# Patient Record
Sex: Female | Born: 1955
Health system: Southern US, Community
[De-identification: ages and names within clinical notes are randomized; demographics above are authoritative.]

## PROBLEM LIST (undated history)

## (undated) DIAGNOSIS — E119 Type 2 diabetes mellitus without complications: Secondary | ICD-10-CM

## (undated) DIAGNOSIS — I10 Essential (primary) hypertension: Secondary | ICD-10-CM

## (undated) DIAGNOSIS — M1990 Unspecified osteoarthritis, unspecified site: Secondary | ICD-10-CM

## (undated) DIAGNOSIS — S82899A Other fracture of unspecified lower leg, initial encounter for closed fracture: Secondary | ICD-10-CM

## (undated) HISTORY — PX: TONSILLECTOMY: SUR1361

## (undated) HISTORY — PX: ANKLE FRACTURE SURGERY: SHX122

---

## 1999-03-16 ENCOUNTER — Other Ambulatory Visit: Admission: RE | Admit: 1999-03-16 | Discharge: 1999-03-16 | Payer: Self-pay | Admitting: Obstetrics and Gynecology

## 1999-08-03 ENCOUNTER — Encounter: Payer: Self-pay | Admitting: Obstetrics and Gynecology

## 1999-08-03 ENCOUNTER — Encounter: Admission: RE | Admit: 1999-08-03 | Discharge: 1999-08-03 | Payer: Self-pay | Admitting: Obstetrics and Gynecology

## 1999-08-18 ENCOUNTER — Encounter: Payer: Self-pay | Admitting: Obstetrics and Gynecology

## 1999-08-18 ENCOUNTER — Encounter: Admission: RE | Admit: 1999-08-18 | Discharge: 1999-08-18 | Payer: Self-pay | Admitting: Obstetrics and Gynecology

## 1999-09-06 ENCOUNTER — Ambulatory Visit (HOSPITAL_COMMUNITY): Admission: RE | Admit: 1999-09-06 | Discharge: 1999-09-06 | Payer: Self-pay | Admitting: *Deleted

## 1999-09-06 ENCOUNTER — Encounter (INDEPENDENT_AMBULATORY_CARE_PROVIDER_SITE_OTHER): Payer: Self-pay

## 2000-09-06 ENCOUNTER — Other Ambulatory Visit: Admission: RE | Admit: 2000-09-06 | Discharge: 2000-09-06 | Payer: Self-pay | Admitting: Obstetrics and Gynecology

## 2000-09-07 ENCOUNTER — Encounter: Admission: RE | Admit: 2000-09-07 | Discharge: 2000-09-07 | Payer: Self-pay | Admitting: Obstetrics and Gynecology

## 2000-09-07 ENCOUNTER — Encounter: Payer: Self-pay | Admitting: Obstetrics and Gynecology

## 2002-06-05 ENCOUNTER — Encounter: Payer: Self-pay | Admitting: Obstetrics and Gynecology

## 2002-06-05 ENCOUNTER — Encounter: Admission: RE | Admit: 2002-06-05 | Discharge: 2002-06-05 | Payer: Self-pay | Admitting: Obstetrics and Gynecology

## 2002-06-11 ENCOUNTER — Other Ambulatory Visit: Admission: RE | Admit: 2002-06-11 | Discharge: 2002-06-11 | Payer: Self-pay | Admitting: Obstetrics and Gynecology

## 2005-06-29 ENCOUNTER — Other Ambulatory Visit: Admission: RE | Admit: 2005-06-29 | Discharge: 2005-06-29 | Payer: Self-pay | Admitting: Obstetrics and Gynecology

## 2007-07-26 ENCOUNTER — Encounter: Admission: RE | Admit: 2007-07-26 | Discharge: 2007-07-26 | Payer: Self-pay | Admitting: Obstetrics and Gynecology

## 2009-05-28 ENCOUNTER — Encounter: Admission: RE | Admit: 2009-05-28 | Discharge: 2009-05-28 | Payer: Self-pay | Admitting: Otolaryngology

## 2009-06-03 ENCOUNTER — Encounter: Admission: RE | Admit: 2009-06-03 | Discharge: 2009-06-03 | Payer: Self-pay | Admitting: Otolaryngology

## 2010-02-03 ENCOUNTER — Inpatient Hospital Stay (HOSPITAL_COMMUNITY): Admission: EM | Admit: 2010-02-03 | Discharge: 2010-02-08 | Payer: Self-pay | Admitting: Emergency Medicine

## 2010-02-07 ENCOUNTER — Ambulatory Visit: Payer: Self-pay | Admitting: Physical Medicine & Rehabilitation

## 2010-07-04 ENCOUNTER — Encounter: Admission: RE | Admit: 2010-07-04 | Discharge: 2010-07-04 | Payer: Self-pay | Admitting: Orthopedic Surgery

## 2010-07-27 ENCOUNTER — Other Ambulatory Visit
Admission: RE | Admit: 2010-07-27 | Discharge: 2010-07-27 | Payer: Self-pay | Source: Home / Self Care | Admitting: Family Medicine

## 2010-08-03 ENCOUNTER — Encounter
Admission: RE | Admit: 2010-08-03 | Discharge: 2010-08-03 | Payer: Self-pay | Source: Home / Self Care | Attending: Family Medicine | Admitting: Family Medicine

## 2010-10-30 LAB — POCT I-STAT, CHEM 8
BUN: 17 mg/dL (ref 6–23)
Calcium, Ion: 1.2 mmol/L (ref 1.12–1.32)
Glucose, Bld: 127 mg/dL — ABNORMAL HIGH (ref 70–99)
HCT: 45 % (ref 36.0–46.0)
Hemoglobin: 15.3 g/dL — ABNORMAL HIGH (ref 12.0–15.0)
TCO2: 25 mmol/L (ref 0–100)

## 2010-10-30 LAB — CBC
HCT: 34.8 % — ABNORMAL LOW (ref 36.0–46.0)
Hemoglobin: 11.9 g/dL — ABNORMAL LOW (ref 12.0–15.0)
MCH: 29.9 pg (ref 26.0–34.0)
MCHC: 33.4 g/dL (ref 30.0–36.0)
MCHC: 33.5 g/dL (ref 30.0–36.0)
MCHC: 34.2 g/dL (ref 30.0–36.0)
MCV: 88.9 fL (ref 78.0–100.0)
MCV: 89.3 fL (ref 78.0–100.0)
Platelets: 272 10*3/uL (ref 150–400)
RBC: 3.92 MIL/uL (ref 3.87–5.11)
RBC: 4.82 MIL/uL (ref 3.87–5.11)
RDW: 14.6 % (ref 11.5–15.5)
WBC: 12.3 10*3/uL — ABNORMAL HIGH (ref 4.0–10.5)
WBC: 8.3 10*3/uL (ref 4.0–10.5)
WBC: 8.4 10*3/uL (ref 4.0–10.5)

## 2010-10-30 LAB — GLUCOSE, CAPILLARY
Glucose-Capillary: 106 mg/dL — ABNORMAL HIGH (ref 70–99)
Glucose-Capillary: 117 mg/dL — ABNORMAL HIGH (ref 70–99)
Glucose-Capillary: 121 mg/dL — ABNORMAL HIGH (ref 70–99)
Glucose-Capillary: 124 mg/dL — ABNORMAL HIGH (ref 70–99)
Glucose-Capillary: 134 mg/dL — ABNORMAL HIGH (ref 70–99)
Glucose-Capillary: 154 mg/dL — ABNORMAL HIGH (ref 70–99)
Glucose-Capillary: 170 mg/dL — ABNORMAL HIGH (ref 70–99)
Glucose-Capillary: 175 mg/dL — ABNORMAL HIGH (ref 70–99)

## 2010-10-30 LAB — COMPREHENSIVE METABOLIC PANEL
Albumin: 3.9 g/dL (ref 3.5–5.2)
Alkaline Phosphatase: 72 U/L (ref 39–117)
BUN: 15 mg/dL (ref 6–23)
CO2: 25 mEq/L (ref 19–32)
Calcium: 10.1 mg/dL (ref 8.4–10.5)
Creatinine, Ser: 0.94 mg/dL (ref 0.4–1.2)
GFR calc Af Amer: >60 mL/min (ref >60)
GFR calc non Af Amer: >60 mL/min (ref >60)
Glucose, Bld: 124 mg/dL — ABNORMAL HIGH (ref 70–99)

## 2010-10-30 LAB — BASIC METABOLIC PANEL
BUN: 14 mg/dL (ref 6–23)
CO2: 30 mEq/L (ref 19–32)
Calcium: 9.4 mg/dL (ref 8.4–10.5)
Chloride: 99 mEq/L (ref 96–112)
Creatinine, Ser: 1.05 mg/dL (ref 0.4–1.2)
GFR calc Af Amer: >60 mL/min (ref >60)
GFR calc non Af Amer: 47 mL/min — ABNORMAL LOW (ref >60)
GFR calc non Af Amer: 54 mL/min — ABNORMAL LOW (ref >60)
Glucose, Bld: 156 mg/dL — ABNORMAL HIGH (ref 70–99)
Potassium: 3.6 mEq/L (ref 3.5–5.1)
Potassium: 4.5 mEq/L (ref 3.5–5.1)

## 2010-10-30 LAB — DIFFERENTIAL
Basophils Absolute: 0.1 10*3/uL (ref 0.0–0.1)
Basophils Relative: 1 % (ref 0–1)
Eosinophils Relative: 2 % (ref 0–5)
Lymphocytes Relative: 12 % (ref 12–46)
Lymphs Abs: 1.4 10*3/uL (ref 0.7–4.0)
Neutrophils Relative %: 80 % — ABNORMAL HIGH (ref 43–77)

## 2010-10-30 LAB — PROTIME-INR: Prothrombin Time: 18.8 seconds — ABNORMAL HIGH (ref 11.6–15.2)

## 2013-12-10 ENCOUNTER — Other Ambulatory Visit: Payer: Self-pay | Admitting: Family Medicine

## 2013-12-10 DIAGNOSIS — Z1231 Encounter for screening mammogram for malignant neoplasm of breast: Secondary | ICD-10-CM

## 2013-12-10 DIAGNOSIS — M8580 Other specified disorders of bone density and structure, unspecified site: Secondary | ICD-10-CM

## 2013-12-31 ENCOUNTER — Ambulatory Visit
Admission: RE | Admit: 2013-12-31 | Discharge: 2013-12-31 | Disposition: A | Discharge status: Home / Self Care | Payer: 59 | Source: Ambulatory Visit | Attending: Family Medicine | Admitting: Family Medicine

## 2013-12-31 DIAGNOSIS — M8580 Other specified disorders of bone density and structure, unspecified site: Secondary | ICD-10-CM

## 2013-12-31 DIAGNOSIS — Z1231 Encounter for screening mammogram for malignant neoplasm of breast: Secondary | ICD-10-CM

## 2017-12-17 DIAGNOSIS — E119 Type 2 diabetes mellitus without complications: Secondary | ICD-10-CM | POA: Diagnosis not present

## 2017-12-17 DIAGNOSIS — E7800 Pure hypercholesterolemia, unspecified: Secondary | ICD-10-CM | POA: Diagnosis not present

## 2017-12-18 DIAGNOSIS — E1122 Type 2 diabetes mellitus with diabetic chronic kidney disease: Secondary | ICD-10-CM | POA: Diagnosis not present

## 2017-12-18 DIAGNOSIS — R05 Cough: Secondary | ICD-10-CM | POA: Diagnosis not present

## 2017-12-18 DIAGNOSIS — N183 Chronic kidney disease, stage 3 (moderate): Secondary | ICD-10-CM | POA: Diagnosis not present

## 2017-12-18 DIAGNOSIS — E7800 Pure hypercholesterolemia, unspecified: Secondary | ICD-10-CM | POA: Diagnosis not present

## 2018-02-18 DIAGNOSIS — E1122 Type 2 diabetes mellitus with diabetic chronic kidney disease: Secondary | ICD-10-CM | POA: Diagnosis not present

## 2018-02-18 DIAGNOSIS — N183 Chronic kidney disease, stage 3 (moderate): Secondary | ICD-10-CM | POA: Diagnosis not present

## 2018-05-10 DIAGNOSIS — E119 Type 2 diabetes mellitus without complications: Secondary | ICD-10-CM | POA: Diagnosis not present

## 2018-05-10 DIAGNOSIS — H5213 Myopia, bilateral: Secondary | ICD-10-CM | POA: Diagnosis not present

## 2018-05-10 DIAGNOSIS — H524 Presbyopia: Secondary | ICD-10-CM | POA: Diagnosis not present

## 2018-08-21 DIAGNOSIS — E1122 Type 2 diabetes mellitus with diabetic chronic kidney disease: Secondary | ICD-10-CM | POA: Diagnosis not present

## 2018-08-21 DIAGNOSIS — Z6841 Body Mass Index (BMI) 40.0 and over, adult: Secondary | ICD-10-CM | POA: Diagnosis not present

## 2018-08-21 DIAGNOSIS — N183 Chronic kidney disease, stage 3 (moderate): Secondary | ICD-10-CM | POA: Diagnosis not present

## 2018-08-21 DIAGNOSIS — E7800 Pure hypercholesterolemia, unspecified: Secondary | ICD-10-CM | POA: Diagnosis not present

## 2018-08-27 DIAGNOSIS — R0781 Pleurodynia: Secondary | ICD-10-CM | POA: Diagnosis not present

## 2018-08-27 DIAGNOSIS — R0789 Other chest pain: Secondary | ICD-10-CM | POA: Diagnosis not present

## 2018-08-27 DIAGNOSIS — I10 Essential (primary) hypertension: Secondary | ICD-10-CM | POA: Diagnosis not present

## 2018-08-27 DIAGNOSIS — R05 Cough: Secondary | ICD-10-CM | POA: Diagnosis not present

## 2018-09-23 DIAGNOSIS — I129 Hypertensive chronic kidney disease with stage 1 through stage 4 chronic kidney disease, or unspecified chronic kidney disease: Secondary | ICD-10-CM | POA: Diagnosis not present

## 2019-03-06 ENCOUNTER — Other Ambulatory Visit: Payer: Self-pay | Admitting: Family Medicine

## 2019-03-06 DIAGNOSIS — Z1231 Encounter for screening mammogram for malignant neoplasm of breast: Secondary | ICD-10-CM

## 2019-04-23 ENCOUNTER — Ambulatory Visit
Admission: RE | Admit: 2019-04-23 | Discharge: 2019-04-23 | Disposition: A | Discharge status: Home / Self Care | Payer: Commercial Managed Care - PPO | Source: Ambulatory Visit | Attending: Family Medicine | Admitting: Family Medicine

## 2019-04-23 ENCOUNTER — Other Ambulatory Visit: Payer: Self-pay

## 2019-04-23 DIAGNOSIS — Z1231 Encounter for screening mammogram for malignant neoplasm of breast: Secondary | ICD-10-CM

## 2019-04-24 ENCOUNTER — Other Ambulatory Visit: Payer: Self-pay | Admitting: Family Medicine

## 2019-04-24 DIAGNOSIS — R928 Other abnormal and inconclusive findings on diagnostic imaging of breast: Secondary | ICD-10-CM

## 2019-04-25 ENCOUNTER — Ambulatory Visit
Admission: RE | Admit: 2019-04-25 | Discharge: 2019-04-25 | Disposition: A | Discharge status: Home / Self Care | Payer: Commercial Managed Care - PPO | Source: Ambulatory Visit | Attending: Family Medicine | Admitting: Family Medicine

## 2019-04-25 ENCOUNTER — Other Ambulatory Visit: Payer: Self-pay

## 2019-04-25 DIAGNOSIS — R928 Other abnormal and inconclusive findings on diagnostic imaging of breast: Secondary | ICD-10-CM

## 2021-01-03 ENCOUNTER — Ambulatory Visit: Payer: Commercial Managed Care - PPO

## 2021-01-03 NOTE — Progress Notes (Signed)
   Covid-19 Vaccination Clinic  Name:  Patricia George    MRN: [id number] DOB: [birth year]  01/03/2021  Patricia George was observed post Covid-19 immunization for 15 minutes without incident. She was provided with Vaccine Information Sheet and instruction to access the V-Safe system.   Patricia George was instructed to call 911 with any severe reactions post vaccine: Marland Kitchen Difficulty breathing  . Swelling of face and throat  . A fast heartbeat  . A bad rash all over body  . Dizziness and weakness

## 2021-01-07 ENCOUNTER — Other Ambulatory Visit (HOSPITAL_BASED_OUTPATIENT_CLINIC_OR_DEPARTMENT_OTHER): Payer: Self-pay

## 2021-01-07 MED ORDER — PFIZER-BIONT COVID-19 VAC-TRIS 30 MCG/0.3ML IM SUSP
INTRAMUSCULAR | 0 refills | Status: AC
  Filled 2021-01-07: qty 0.3, 1d supply, fill #0

## 2021-01-11 ENCOUNTER — Other Ambulatory Visit (HOSPITAL_BASED_OUTPATIENT_CLINIC_OR_DEPARTMENT_OTHER): Payer: Self-pay

## 2021-02-21 DIAGNOSIS — N183 Chronic kidney disease, stage 3 unspecified: Secondary | ICD-10-CM | POA: Diagnosis not present

## 2021-02-21 DIAGNOSIS — E7800 Pure hypercholesterolemia, unspecified: Secondary | ICD-10-CM | POA: Diagnosis not present

## 2021-02-21 DIAGNOSIS — I129 Hypertensive chronic kidney disease with stage 1 through stage 4 chronic kidney disease, or unspecified chronic kidney disease: Secondary | ICD-10-CM | POA: Diagnosis not present

## 2021-02-21 DIAGNOSIS — E1122 Type 2 diabetes mellitus with diabetic chronic kidney disease: Secondary | ICD-10-CM | POA: Diagnosis not present

## 2021-02-21 DIAGNOSIS — Z7984 Long term (current) use of oral hypoglycemic drugs: Secondary | ICD-10-CM | POA: Diagnosis not present

## 2021-07-01 DIAGNOSIS — E7800 Pure hypercholesterolemia, unspecified: Secondary | ICD-10-CM | POA: Diagnosis not present

## 2021-07-01 DIAGNOSIS — E1122 Type 2 diabetes mellitus with diabetic chronic kidney disease: Secondary | ICD-10-CM | POA: Diagnosis not present

## 2021-07-01 DIAGNOSIS — Z0000 Encounter for general adult medical examination without abnormal findings: Secondary | ICD-10-CM | POA: Diagnosis not present

## 2021-07-01 DIAGNOSIS — Z23 Encounter for immunization: Secondary | ICD-10-CM | POA: Diagnosis not present

## 2021-07-01 DIAGNOSIS — I129 Hypertensive chronic kidney disease with stage 1 through stage 4 chronic kidney disease, or unspecified chronic kidney disease: Secondary | ICD-10-CM | POA: Diagnosis not present

## 2021-08-10 DIAGNOSIS — H524 Presbyopia: Secondary | ICD-10-CM | POA: Diagnosis not present

## 2021-08-10 DIAGNOSIS — E119 Type 2 diabetes mellitus without complications: Secondary | ICD-10-CM | POA: Diagnosis not present

## 2021-08-10 DIAGNOSIS — H5213 Myopia, bilateral: Secondary | ICD-10-CM | POA: Diagnosis not present

## 2022-01-11 ENCOUNTER — Other Ambulatory Visit: Payer: Self-pay | Admitting: Family Medicine

## 2022-01-11 DIAGNOSIS — E7800 Pure hypercholesterolemia, unspecified: Secondary | ICD-10-CM | POA: Diagnosis not present

## 2022-01-11 DIAGNOSIS — E1122 Type 2 diabetes mellitus with diabetic chronic kidney disease: Secondary | ICD-10-CM | POA: Diagnosis not present

## 2022-01-11 DIAGNOSIS — Z23 Encounter for immunization: Secondary | ICD-10-CM | POA: Diagnosis not present

## 2022-01-11 DIAGNOSIS — Z1231 Encounter for screening mammogram for malignant neoplasm of breast: Secondary | ICD-10-CM | POA: Diagnosis not present

## 2022-01-11 DIAGNOSIS — I129 Hypertensive chronic kidney disease with stage 1 through stage 4 chronic kidney disease, or unspecified chronic kidney disease: Secondary | ICD-10-CM | POA: Diagnosis not present

## 2022-01-11 DIAGNOSIS — I872 Venous insufficiency (chronic) (peripheral): Secondary | ICD-10-CM | POA: Diagnosis not present

## 2022-01-11 DIAGNOSIS — Z79899 Other long term (current) drug therapy: Secondary | ICD-10-CM | POA: Diagnosis not present

## 2022-01-11 DIAGNOSIS — I700 Atherosclerosis of aorta: Secondary | ICD-10-CM | POA: Diagnosis not present

## 2022-01-11 DIAGNOSIS — N183 Chronic kidney disease, stage 3 unspecified: Secondary | ICD-10-CM | POA: Diagnosis not present

## 2022-01-21 ENCOUNTER — Emergency Department (HOSPITAL_BASED_OUTPATIENT_CLINIC_OR_DEPARTMENT_OTHER): Payer: Medicare Other

## 2022-01-21 ENCOUNTER — Encounter (HOSPITAL_BASED_OUTPATIENT_CLINIC_OR_DEPARTMENT_OTHER): Payer: Self-pay | Admitting: Emergency Medicine

## 2022-01-21 ENCOUNTER — Emergency Department (HOSPITAL_BASED_OUTPATIENT_CLINIC_OR_DEPARTMENT_OTHER)
Admission: EM | Admit: 2022-01-21 | Discharge: 2022-01-21 | Disposition: A | Discharge status: Home / Self Care | Payer: Medicare Other | Attending: Emergency Medicine | Admitting: Emergency Medicine

## 2022-01-21 DIAGNOSIS — S199XXA Unspecified injury of neck, initial encounter: Secondary | ICD-10-CM | POA: Diagnosis not present

## 2022-01-21 DIAGNOSIS — M25522 Pain in left elbow: Secondary | ICD-10-CM | POA: Diagnosis not present

## 2022-01-21 DIAGNOSIS — W19XXXA Unspecified fall, initial encounter: Secondary | ICD-10-CM | POA: Diagnosis not present

## 2022-01-21 DIAGNOSIS — S42202A Unspecified fracture of upper end of left humerus, initial encounter for closed fracture: Secondary | ICD-10-CM | POA: Diagnosis not present

## 2022-01-21 DIAGNOSIS — S42352A Displaced comminuted fracture of shaft of humerus, left arm, initial encounter for closed fracture: Secondary | ICD-10-CM | POA: Diagnosis not present

## 2022-01-21 DIAGNOSIS — S42292A Other displaced fracture of upper end of left humerus, initial encounter for closed fracture: Secondary | ICD-10-CM | POA: Diagnosis not present

## 2022-01-21 DIAGNOSIS — M47812 Spondylosis without myelopathy or radiculopathy, cervical region: Secondary | ICD-10-CM | POA: Diagnosis not present

## 2022-01-21 DIAGNOSIS — S4992XA Unspecified injury of left shoulder and upper arm, initial encounter: Secondary | ICD-10-CM | POA: Diagnosis present

## 2022-01-21 DIAGNOSIS — S0990XA Unspecified injury of head, initial encounter: Secondary | ICD-10-CM | POA: Diagnosis not present

## 2022-01-21 DIAGNOSIS — S42295A Other nondisplaced fracture of upper end of left humerus, initial encounter for closed fracture: Secondary | ICD-10-CM | POA: Diagnosis not present

## 2022-01-21 HISTORY — DX: Type 2 diabetes mellitus without complications: E11.9

## 2022-01-21 HISTORY — DX: Other fracture of unspecified lower leg, initial encounter for closed fracture: S82.899A

## 2022-01-21 MED ORDER — TRAMADOL HCL 50 MG PO TABS
50.0000 mg | ORAL_TABLET | Freq: Once | ORAL | Status: AC
  Administered 2022-01-21: 50 mg via ORAL
  Filled 2022-01-21: qty 1

## 2022-01-21 MED ORDER — TRAMADOL HCL 50 MG PO TABS: 50.0000 mg | ORAL_TABLET | Freq: Three times a day (TID) | ORAL | 0 refills | Status: AC | PRN

## 2022-01-21 MED ORDER — ACETAMINOPHEN 500 MG PO TABS
1000.0000 mg | ORAL_TABLET | Freq: Once | ORAL | Status: AC
  Administered 2022-01-21: 1000 mg via ORAL
  Filled 2022-01-21: qty 2

## 2022-01-21 MED ORDER — KETOROLAC TROMETHAMINE 60 MG/2ML IM SOLN
30.0000 mg | Freq: Once | INTRAMUSCULAR | Status: AC
  Administered 2022-01-21: 30 mg via INTRAMUSCULAR
  Filled 2022-01-21: qty 2

## 2022-01-21 NOTE — ED Provider Notes (Addendum)
[facility] Provider Note   CSN: [id number] Arrival date & time: 01/21/22  [id number]     History  Chief Complaint  Patient presents with   Fall   Arm Injury    Patricia George is a 66 y.o. female.  The history is provided by the patient.  Fall This is a new problem. The current episode started less than 1 hour ago. The problem occurs constantly. The problem has not changed since onset.Pertinent negatives include no chest pain, no abdominal pain, no headaches and no shortness of breath. Nothing aggravates the symptoms. Nothing relieves the symptoms. She has tried nothing for the symptoms. The treatment provided no relief.  Arm Injury Location:  Arm Arm location:  L arm Injury: yes   Time since incident:  1 hour Mechanism of injury: fall   Fall:    Fall occurred:  Walking   Impact surface:  Hard floor   Point of impact:  Front   Entrapped after fall: no   Pain details:    Quality:  Aching   Radiates to:  Does not radiate   Severity:  Severe   Onset quality:  Sudden   Duration:  1 hour   Timing:  Constant   Progression:  Unchanged Dislocation: no   Foreign body present:  No foreign bodies Prior injury to area:  No Relieved by:  Nothing Worsened by:  Nothing Ineffective treatments:  None tried Associated symptoms: no back pain, no fever and no swelling   Risk factors: no concern for non-accidental trauma        Home Medications Prior to Admission medications   Medication Sig Start Date End Date Taking? Authorizing Provider  COVID-19 mRNA Vac-TriS, Pfizer, (PFIZER-BIONT COVID-19 VAC-TRIS) SUSP injection Inject into the muscle. 01/03/21   Carlyle Basques, MD      Allergies    Patient has no known allergies.    Review of Systems   Review of Systems  Constitutional:  Negative for fever.  Eyes:  Negative for redness.  Respiratory:  Negative for shortness of breath.   Cardiovascular:  Negative for chest pain.  Gastrointestinal:   Negative for abdominal pain.  Musculoskeletal:  Positive for arthralgias. Negative for back pain.  Neurological:  Negative for headaches.    Physical Exam Updated Vital Signs BP 131/78 (BP Location: Right Arm)   Pulse 78   Temp 98.2 F (36.8 C) (Oral)   Resp 18   Ht 5\' 7"  (1.702 m)   Wt 122.5 kg   SpO2 91%   BMI 42.29 kg/m  Physical Exam Vitals and nursing note reviewed.  Constitutional:      General: She is not in acute distress.    Appearance: Normal appearance.  HENT:     Head: Normocephalic and atraumatic.     Nose: Nose normal.  Eyes:     Conjunctiva/sclera: Conjunctivae normal.     Pupils: Pupils are equal, round, and reactive to light.  Cardiovascular:     Rate and Rhythm: Normal rate and regular rhythm.     Pulses: Normal pulses.     Heart sounds: Normal heart sounds.  Pulmonary:     Effort: Pulmonary effort is normal.     Breath sounds: Normal breath sounds.  Abdominal:     General: Abdomen is flat. Bowel sounds are normal.     Palpations: Abdomen is soft.     Tenderness: There is no abdominal tenderness. There is no guarding.  Musculoskeletal:     Left upper arm:  Tenderness and bony tenderness present. No swelling.     Left elbow: Normal.     Left forearm: Normal.     Left wrist: Normal. No snuff box tenderness.     Left hand: Normal.     Cervical back: Normal range of motion and neck supple.     Comments: Biceps and triceps tendons intact   Skin:    General: Skin is warm and dry.     Capillary Refill: Capillary refill takes less than 2 seconds.  Neurological:     General: No focal deficit present.     Mental Status: She is alert and oriented to person, place, and time.     Deep Tendon Reflexes: Reflexes normal.  Psychiatric:        Mood and Affect: Mood normal.        Behavior: Behavior normal.     ED Results / Procedures / Treatments   Labs (all labs ordered are listed, but only abnormal results are displayed) Labs Reviewed - No data to  display  EKG None  Radiology DG Humerus Left  Result Date: 01/21/2022 CLINICAL DATA:  Tripped over stool.  Fall. EXAM: LEFT HUMERUS - 2+ VIEW COMPARISON:  None Available. FINDINGS: There is an acute and comminuted fracture deformity involving the proximal left humerus. There is mild medial angulation of the distal fracture fragments. The left shoulder appears located. IMPRESSION: Acute and comminuted fracture deformity involves the proximal left humerus. Electronically Signed   By: Kerby Moors M.D.   On: 01/21/2022 05:54   CT Head Wo Contrast  Result Date: 01/21/2022 CLINICAL DATA:  Blunt poly trauma EXAM: CT HEAD WITHOUT CONTRAST CT CERVICAL SPINE WITHOUT CONTRAST TECHNIQUE: Multidetector CT imaging of the head and cervical spine was performed following the standard protocol without intravenous contrast. Multiplanar CT image reconstructions of the cervical spine were also generated. RADIATION DOSE REDUCTION: This exam was performed according to the departmental dose-optimization program which includes automated exposure control, adjustment of the mA and/or kV according to patient size and/or use of iterative reconstruction technique. COMPARISON:  None Available. FINDINGS: CT HEAD FINDINGS Brain: No evidence of swelling, infarction, hemorrhage, hydrocephalus, extra-axial collection or mass lesion/mass effect. Vascular: No hyperdense vessel or unexpected calcification. Skull: Normal. Negative for fracture or focal lesion. Sinuses/Orbits: No acute finding. CT CERVICAL SPINE FINDINGS Alignment: No traumatic malalignment Skull base and vertebrae: No acute fracture or incidental bone lesion Soft tissues and spinal canal: No prevertebral fluid or swelling. No visible canal hematoma. Disc levels: Ordinary lower cervical disc space narrowing and ridging. Upper cervical facet osteoarthritis. Upper chest: Negative IMPRESSION: No evidence of acute intracranial or cervical spine injury. Electronically Signed    By: Jorje Guild M.D.   On: 01/21/2022 05:39   CT Cervical Spine Wo Contrast  Result Date: 01/21/2022 CLINICAL DATA:  Blunt poly trauma EXAM: CT HEAD WITHOUT CONTRAST CT CERVICAL SPINE WITHOUT CONTRAST TECHNIQUE: Multidetector CT imaging of the head and cervical spine was performed following the standard protocol without intravenous contrast. Multiplanar CT image reconstructions of the cervical spine were also generated. RADIATION DOSE REDUCTION: This exam was performed according to the departmental dose-optimization program which includes automated exposure control, adjustment of the mA and/or kV according to patient size and/or use of iterative reconstruction technique. COMPARISON:  None Available. FINDINGS: CT HEAD FINDINGS Brain: No evidence of swelling, infarction, hemorrhage, hydrocephalus, extra-axial collection or mass lesion/mass effect. Vascular: No hyperdense vessel or unexpected calcification. Skull: Normal. Negative for fracture or focal lesion. Sinuses/Orbits: No acute  finding. CT CERVICAL SPINE FINDINGS Alignment: No traumatic malalignment Skull base and vertebrae: No acute fracture or incidental bone lesion Soft tissues and spinal canal: No prevertebral fluid or swelling. No visible canal hematoma. Disc levels: Ordinary lower cervical disc space narrowing and ridging. Upper cervical facet osteoarthritis. Upper chest: Negative IMPRESSION: No evidence of acute intracranial or cervical spine injury. Electronically Signed   By: Jorje Guild M.D.   On: 01/21/2022 05:39    Procedures Procedures    Medications Ordered in ED Medications  traMADol (ULTRAM) tablet 50 mg (50 mg Oral Given 01/21/22 0417)  acetaminophen (TYLENOL) tablet 1,000 mg (1,000 mg Oral Given 01/21/22 0417)  ketorolac (TORADOL) injection 30 mg (30 mg Intramuscular Given 01/21/22 0419)    ED Course/ Medical Decision Making/ A&P                           Medical Decision Making Patient fell and injured LUE. On  aspirin  Amount and/or Complexity of Data Reviewed Independent Historian: EMS    Details: see above Radiology: ordered and independent interpretation performed.    Details: humerus fracture comminuted, negative head and c spine CT Discussion of management or test interpretation with external provider(s): Case d/w Dr. Sammuel Hines, please CT LUE and place in sling and follow up in the office   Risk OTC drugs. Prescription drug management. Risk Details: Ice and maintain sling at all times.  Take all pain medication as directed.  Do not drink alcohol, drive a car or operate machinery while taking narcotic pain medication.  Follow up with Dr. Sammuel Hines.      Final Clinical Impression(s) / ED Diagnoses Final diagnoses:  None  Return for intractable cough, coughing up blood, fevers > 100.4 unrelieved by medication, shortness of breath, intractable vomiting, chest pain, shortness of breath, weakness, numbness, changes in speech, facial asymmetry, abdominal pain, passing out, Inability to tolerate liquids or food, cough, altered mental status or any concerns. No signs of systemic illness or infection. The patient is nontoxic-appearing on exam and vital signs are within normal limits.  I have reviewed the triage vital signs and the nursing notes. Pertinent labs & imaging results that were available during my care of the patient were reviewed by me and considered in my medical decision making (see chart for details). After history, exam, and medical workup I feel the patient has been appropriately medically screened and is safe for discharge home. Pertinent diagnoses were discussed with the patient. Patient was given return precautions.  Rx / DC Orders ED Discharge Orders     None           Bellatrix Devonshire, MD 01/21/22 NO:[id number]

## 2022-01-21 NOTE — Discharge Instructions (Signed)
You may take tylenol in addition to prescribed medication for pain.  You cannot drink alcohol, drive a car or operate machinery while taking pain medication.

## 2022-01-21 NOTE — ED Triage Notes (Signed)
Pt walking to bathroom about 2 hours ago. Tripped over a stool, fell. Pain to left outer upper arm.

## 2022-01-25 ENCOUNTER — Other Ambulatory Visit (HOSPITAL_BASED_OUTPATIENT_CLINIC_OR_DEPARTMENT_OTHER): Payer: Self-pay

## 2022-01-25 ENCOUNTER — Ambulatory Visit (INDEPENDENT_AMBULATORY_CARE_PROVIDER_SITE_OTHER): Payer: Medicare Other | Admitting: Orthopaedic Surgery

## 2022-01-25 DIAGNOSIS — S42332A Displaced oblique fracture of shaft of humerus, left arm, initial encounter for closed fracture: Secondary | ICD-10-CM

## 2022-01-25 MED ORDER — ACETAMINOPHEN 500 MG PO TABS
500.0000 mg | ORAL_TABLET | Freq: Three times a day (TID) | ORAL | 0 refills | Status: AC
  Filled 2022-01-25: qty 30, 10d supply, fill #0

## 2022-01-25 MED ORDER — IBUPROFEN 800 MG PO TABS
800.0000 mg | ORAL_TABLET | Freq: Three times a day (TID) | ORAL | 0 refills | Status: AC
  Filled 2022-01-25: qty 30, 10d supply, fill #0

## 2022-01-25 MED ORDER — OXYCODONE HCL 5 MG PO TABS
5.0000 mg | ORAL_TABLET | ORAL | 0 refills | Status: AC | PRN
  Filled 2022-01-25: qty 20, 4d supply, fill #0

## 2022-01-25 MED ORDER — ASPIRIN 325 MG PO TBEC
325.0000 mg | DELAYED_RELEASE_TABLET | Freq: Every day | ORAL | 0 refills | Status: AC
  Filled 2022-01-25: qty 30, 30d supply, fill #0

## 2022-01-25 NOTE — H&P (View-Only) (Signed)
Chief Complaint: Humerus fracture     History of Present Illness:    Patricia George is a 66 y.o. female right-hand-dominant female presents with a left proximal humeral shaft fracture she tripped over a stool at home.  She states that since that time she has had pain about the proximal aspect of the left shoulder.  She went to the emergency room and was placed in a sling and given follow-up here.  Since that time she has been back home.  She does live alone and is quite independent although she has been very limited by the left arm.  She is having an extremely difficult time doing even basic activities like feeding her cats.  She is taking Tylenol and tramadol for the pain at this time.  Denies any numbness in the left hand.  Overall her pain is mildly improving although she is quite concerned about her level of independence.  She enjoys knitting in her spare time.    Surgical History:   None  PMH/PSH/Family History/Social History/Meds/Allergies:    Past Medical History:  Diagnosis Date   Ankle fracture    Diabetes mellitus without complication (HCC)    Past Surgical History:  Procedure Laterality Date   ANKLE FRACTURE SURGERY Right    Social History   Socioeconomic History   Marital status: Single    Spouse name: Not on file   Number of children: Not on file   Years of education: Not on file   Highest education level: Not on file  Occupational History   Not on file  Tobacco Use   Smoking status: Never   Smokeless tobacco: Never  Substance and Sexual Activity   Alcohol use: Yes   Drug use: Never   Sexual activity: Not on file  Other Topics Concern   Not on file  Social History Narrative   Not on file   Social Determinants of Health   Financial Resource Strain: Not on file  Food Insecurity: Not on file  Transportation Needs: Not on file  Physical Activity: Not on file  Stress: Not on file  Social Connections: Not on file    No family history on file. No Known Allergies Current Outpatient Medications  Medication Sig Dispense Refill   acetaminophen (TYLENOL) 500 MG tablet Take 1 tablet (500 mg total) by mouth every 8 (eight) hours for 10 days. 30 tablet 0   aspirin EC 325 MG tablet Take 1 tablet (325 mg total) by mouth daily. 30 tablet 0   ibuprofen (ADVIL) 800 MG tablet Take 1 tablet (800 mg total) by mouth every 8 (eight) hours for 10 days. Please take with food, please alternate with acetaminophen 30 tablet 0   oxycodone (OXY-IR) 5 MG capsule Take 1 capsule (5 mg total) by mouth every 4 (four) hours as needed (severe pain). 20 capsule 0   COVID-19 mRNA Vac-TriS, Pfizer, (PFIZER-BIONT COVID-19 VAC-TRIS) SUSP injection Inject into the muscle. 0.3 mL 0   metFORMIN (GLUCOPHAGE-XR) 750 MG 24 hr tablet Take 1,500 mg by mouth daily.     metoprolol succinate (TOPROL-XL) 50 MG 24 hr tablet Take 50 mg by mouth daily.     pravastatin (PRAVACHOL) 40 MG tablet Take 40 mg by mouth daily.     sertraline (ZOLOFT) 50 MG tablet Take 50 mg by mouth daily.  traMADol (ULTRAM) 50 MG tablet Take 1 tablet (50 mg total) by mouth every 8 (eight) hours as needed for severe pain. 11 tablet 0   No current facility-administered medications for this visit.   No results found.  Review of Systems:   A ROS was performed including pertinent positives and negatives as documented in the HPI.  Physical Exam :   Constitutional: NAD and appears stated age Neurological: Alert and oriented Psych: Appropriate affect and cooperative There were no vitals taken for this visit.   Comprehensive Musculoskeletal Exam:    Left arm is in a sling.  Sensation is intact light touch in all distributions of the left arm including axillary musculocutaneous ulnar median and radial.  Able to fire EPL as well as wrist extensors of the left wrist.  She is able to make a solid composite fist.  2+ radial pulse  Imaging:   Xray (2 views left humerus): Left  proximal humeral shaft fracture in good alignment  CT scan left shoulder: There is a proximal metaphyseal fracture involving the proximal humerus without extension into the head  I personally reviewed and interpreted the radiographs.   Assessment:   66 y.o. female right-hand-dominant presents with a left proximal humeral shaft fracture after a trip over stool.  Overall today's visit we did have a long discussion regarding operative versus nonoperative management.  She is diabetic currently on metformin not taking insulin.  I did discuss that with nonoperative management there would be a period of bracing with a Sarmiento brace allowing the callus to form.  We discussed that this would limit her quite significantly while this is healing.  With regard to operative fixation I would recommend potentially a humeral nail in order to give her weightbearing construct.  We did discuss the specific risks associated with this including shoulder pain and rotator cuff pain as well as nerve injury that could result in wrist drop.  We discussed the risks and benefits of this.  We also discussed that diabetes cannot only impact wound healing surgically but also callus formation nonsurgically.  After long discussion of both options she is ultimately elected for a humeral nail as she currently lives alone and is quite concerned about her ability to do basic tasks like feeding her cats.  We will plan for left humeral nail as result.  Plan :    -Plan for left humeral intramedullary nail   After a lengthy discussion of treatment options, including risks, benefits, alternatives, complications of surgical and nonsurgical conservative options, the patient elected surgical repair.   The patient  is aware of the material risks  and complications including, but not limited to injury to adjacent structures, neurovascular injury, infection, numbness, bleeding, implant failure, thermal burns, stiffness, persistent pain, failure  to heal, disease transmission from allograft, need for further surgery, dislocation, anesthetic risks, blood clots, risks of death,and others. The probabilities of surgical success and failure discussed with patient given their particular co-morbidities.The time and nature of expected rehabilitation and recovery was discussed.The patient's questions were all answered preoperatively.  No barriers to understanding were noted. I explained the natural history of the disease process and Rx rationale.  I explained to the patient what I considered to be reasonable expectations given their personal situation.  The final treatment plan was arrived at through a shared patient decision making process model.      I personally saw and evaluated the patient, and participated in the management and treatment plan.  Huel Cote, MD Attending  Physician, Orthopedic Surgery  This document was dictated using Dragon voice recognition software. A reasonable attempt at proof reading has been made to minimize errors.

## 2022-01-25 NOTE — Progress Notes (Signed)
Chief Complaint: Humerus fracture     History of Present Illness:    Patricia George is a 66 y.o. female right-hand-dominant female presents with a left proximal humeral shaft fracture she tripped over a stool at home.  She states that since that time she has had pain about the proximal aspect of the left shoulder.  She went to the emergency room and was placed in a sling and given follow-up here.  Since that time she has been back home.  She does live alone and is quite independent although she has been very limited by the left arm.  She is having an extremely difficult time doing even basic activities like feeding her cats.  She is taking Tylenol and tramadol for the pain at this time.  Denies any numbness in the left hand.  Overall her pain is mildly improving although she is quite concerned about her level of independence.  She enjoys knitting in her spare time.    Surgical History:   None  PMH/PSH/Family History/Social History/Meds/Allergies:    Past Medical History:  Diagnosis Date   Ankle fracture    Diabetes mellitus without complication (HCC)    Past Surgical History:  Procedure Laterality Date   ANKLE FRACTURE SURGERY Right    Social History   Socioeconomic History   Marital status: Single    Spouse name: Not on file   Number of children: Not on file   Years of education: Not on file   Highest education level: Not on file  Occupational History   Not on file  Tobacco Use   Smoking status: Never   Smokeless tobacco: Never  Substance and Sexual Activity   Alcohol use: Yes   Drug use: Never   Sexual activity: Not on file  Other Topics Concern   Not on file  Social History Narrative   Not on file   Social Determinants of Health   Financial Resource Strain: Not on file  Food Insecurity: Not on file  Transportation Needs: Not on file  Physical Activity: Not on file  Stress: Not on file  Social Connections: Not on file    No family history on file. No Known Allergies Current Outpatient Medications  Medication Sig Dispense Refill   acetaminophen (TYLENOL) 500 MG tablet Take 1 tablet (500 mg total) by mouth every 8 (eight) hours for 10 days. 30 tablet 0   aspirin EC 325 MG tablet Take 1 tablet (325 mg total) by mouth daily. 30 tablet 0   ibuprofen (ADVIL) 800 MG tablet Take 1 tablet (800 mg total) by mouth every 8 (eight) hours for 10 days. Please take with food, please alternate with acetaminophen 30 tablet 0   oxycodone (OXY-IR) 5 MG capsule Take 1 capsule (5 mg total) by mouth every 4 (four) hours as needed (severe pain). 20 capsule 0   COVID-19 mRNA Vac-TriS, Pfizer, (PFIZER-BIONT COVID-19 VAC-TRIS) SUSP injection Inject into the muscle. 0.3 mL 0   metFORMIN (GLUCOPHAGE-XR) 750 MG 24 hr tablet Take 1,500 mg by mouth daily.     metoprolol succinate (TOPROL-XL) 50 MG 24 hr tablet Take 50 mg by mouth daily.     pravastatin (PRAVACHOL) 40 MG tablet Take 40 mg by mouth daily.     sertraline (ZOLOFT) 50 MG tablet Take 50 mg by mouth daily.  traMADol (ULTRAM) 50 MG tablet Take 1 tablet (50 mg total) by mouth every 8 (eight) hours as needed for severe pain. 11 tablet 0   No current facility-administered medications for this visit.   No results found.  Review of Systems:   A ROS was performed including pertinent positives and negatives as documented in the HPI.  Physical Exam :   Constitutional: NAD and appears stated age Neurological: Alert and oriented Psych: Appropriate affect and cooperative There were no vitals taken for this visit.   Comprehensive Musculoskeletal Exam:    Left arm is in a sling.  Sensation is intact light touch in all distributions of the left arm including axillary musculocutaneous ulnar median and radial.  Able to fire EPL as well as wrist extensors of the left wrist.  She is able to make a solid composite fist.  2+ radial pulse  Imaging:   Xray (2 views left humerus): Left  proximal humeral shaft fracture in good alignment  CT scan left shoulder: There is a proximal metaphyseal fracture involving the proximal humerus without extension into the head  I personally reviewed and interpreted the radiographs.   Assessment:   66 y.o. female right-hand-dominant presents with a left proximal humeral shaft fracture after a trip over stool.  Overall today's visit we did have a long discussion regarding operative versus nonoperative management.  She is diabetic currently on metformin not taking insulin.  I did discuss that with nonoperative management there would be a period of bracing with a Sarmiento brace allowing the callus to form.  We discussed that this would limit her quite significantly while this is healing.  With regard to operative fixation I would recommend potentially a humeral nail in order to give her weightbearing construct.  We did discuss the specific risks associated with this including shoulder pain and rotator cuff pain as well as nerve injury that could result in wrist drop.  We discussed the risks and benefits of this.  We also discussed that diabetes cannot only impact wound healing surgically but also callus formation nonsurgically.  After long discussion of both options she is ultimately elected for a humeral nail as she currently lives alone and is quite concerned about her ability to do basic tasks like feeding her cats.  We will plan for left humeral nail as result.  Plan :    -Plan for left humeral intramedullary nail   After a lengthy discussion of treatment options, including risks, benefits, alternatives, complications of surgical and nonsurgical conservative options, the patient elected surgical repair.   The patient  is aware of the material risks  and complications including, but not limited to injury to adjacent structures, neurovascular injury, infection, numbness, bleeding, implant failure, thermal burns, stiffness, persistent pain, failure  to heal, disease transmission from allograft, need for further surgery, dislocation, anesthetic risks, blood clots, risks of death,and others. The probabilities of surgical success and failure discussed with patient given their particular co-morbidities.The time and nature of expected rehabilitation and recovery was discussed.The patient's questions were all answered preoperatively.  No barriers to understanding were noted. I explained the natural history of the disease process and Rx rationale.  I explained to the patient what I considered to be reasonable expectations given their personal situation.  The final treatment plan was arrived at through a shared patient decision making process model.      I personally saw and evaluated the patient, and participated in the management and treatment plan.  Huel Cote, MD Attending  Physician, Orthopedic Surgery  This document was dictated using Dragon voice recognition software. A reasonable attempt at proof reading has been made to minimize errors.

## 2022-01-27 ENCOUNTER — Encounter (HOSPITAL_COMMUNITY): Payer: Self-pay | Admitting: Orthopaedic Surgery

## 2022-01-27 ENCOUNTER — Other Ambulatory Visit: Payer: Self-pay

## 2022-01-27 NOTE — Progress Notes (Signed)
PCP - Sigmund Hazel, MD Cardiologist - denies  PPM/ICD - denies  Chest x-ray - n/a EKG - day of surgery - 01/31/22 Stress Test - denies ECHO - denies Cardiac Cath - denies  CPAP - n/a  Fasting Blood Sugar - patient is not checking CBG at home  Blood Thinner Instructions: n/a Aspirin Instructions: - last dose per patient - 01/20/22  ERAS Protcol - yes, until 12:00  COVID TEST- n/a  Anesthesia review: no  Patient verbally denies any shortness of breath, fever, cough and chest pain during phone call   -------------  SDW INSTRUCTIONS given:  Your procedure is scheduled on Tuesday, June 20th, 2023.  Report to [facility] Main Entrance "A" at 12:30 A.M., and check in at the Admitting office.  Call this number if you have problems the morning of surgery:  [phone]   Remember:  Do not eat after midnight the night before your surgery  You may drink clear liquids until 12:00 the morning of your surgery.   Clear liquids allowed are: Water, Non-Citrus Juices (without pulp), Carbonated Beverages, Clear Tea, Black Coffee Only, and Gatorade    Take these medicines the morning of surgery with A SIP OF WATER Metoprolol, Pravastatin, Zoloft; PRN: Tylenol, Oxycodone, Tramadol.  Patient was instructed to hold Metformin the day of surgery.  As of today, STOP taking any Aspirin (unless otherwise instructed by your surgeon) Aleve, Naproxen, Ibuprofen, Motrin, Advil, Goody's, BC's, all herbal medications, fish oil, and all vitamins.   The day of surgery:                     Do not wear jewelry, make up, or nail polish            Do not wear lotions, powders, perfumes, or deodorant.            Do not shave 48 hours prior to surgery.              Do not bring valuables to the hospital.            [facility] is not responsible for any belongings or valuables.  Do NOT Smoke (Tobacco/Vaping) 24 hours prior to your procedure If you use a CPAP at night, you may bring all equipment for  your overnight stay.   Contacts, glasses, dentures or bridgework may not be worn into surgery.      For patients admitted to the hospital, discharge time will be determined by your treatment team.   Patients discharged the day of surgery will not be allowed to drive home, and someone needs to stay with them for 24 hours.  Special instructions:   [facility]- Preparing For Surgery  Before surgery, you can play an important role. Because skin is not sterile, your skin needs to be as free of germs as possible. You can reduce the number of germs on your skin by washing with CHG (chlorahexidine gluconate) Soap before surgery.  CHG is an antiseptic cleaner which kills germs and bonds with the skin to continue killing germs even after washing.    Oral Hygiene is also important to reduce your risk of infection.  Remember - BRUSH YOUR TEETH THE MORNING OF SURGERY WITH YOUR REGULAR TOOTHPASTE  Please do not use if you have an allergy to CHG or antibacterial soaps. If your skin becomes reddened/irritated stop using the CHG.  Do not shave (including legs and underarms) for at least 48 hours prior to first CHG shower. It  is OK to shave your face.  Please follow these instructions carefully.   Shower the NIGHT BEFORE SURGERY and the MORNING OF SURGERY with DIAL Soap.   Pat yourself dry with a CLEAN TOWEL.  Wear CLEAN PAJAMAS to bed the night before surgery  Place CLEAN SHEETS on your bed the night of your first shower and DO NOT SLEEP WITH PETS.   Day of Surgery: Please shower morning of surgery  Wear Clean/Comfortable clothing the morning of surgery Do not apply any deodorants/lotions.   Remember to brush your teeth WITH YOUR REGULAR TOOTHPASTE.   Questions were answered. Patient verbalized understanding of instructions.

## 2022-01-30 ENCOUNTER — Ambulatory Visit (HOSPITAL_BASED_OUTPATIENT_CLINIC_OR_DEPARTMENT_OTHER): Payer: Self-pay | Admitting: Orthopaedic Surgery

## 2022-01-30 DIAGNOSIS — S42332A Displaced oblique fracture of shaft of humerus, left arm, initial encounter for closed fracture: Secondary | ICD-10-CM

## 2022-01-31 ENCOUNTER — Other Ambulatory Visit: Payer: Self-pay

## 2022-01-31 ENCOUNTER — Encounter (HOSPITAL_COMMUNITY): Payer: Self-pay | Admitting: Orthopaedic Surgery

## 2022-01-31 ENCOUNTER — Ambulatory Visit (HOSPITAL_COMMUNITY)
Admission: RE | Admit: 2022-01-31 | Discharge: 2022-01-31 | Disposition: A | Discharge status: Home / Self Care | Payer: Medicare Other | Attending: Orthopaedic Surgery | Admitting: Orthopaedic Surgery

## 2022-01-31 ENCOUNTER — Ambulatory Visit (HOSPITAL_COMMUNITY): Payer: Medicare Other | Admitting: Anesthesiology

## 2022-01-31 ENCOUNTER — Ambulatory Visit (HOSPITAL_COMMUNITY): Payer: Medicare Other

## 2022-01-31 ENCOUNTER — Encounter (HOSPITAL_COMMUNITY)
Admission: RE | Disposition: A | Discharge status: Home / Self Care | Payer: Self-pay | Source: Home / Self Care | Attending: Orthopaedic Surgery

## 2022-01-31 ENCOUNTER — Ambulatory Visit (HOSPITAL_BASED_OUTPATIENT_CLINIC_OR_DEPARTMENT_OTHER): Payer: Medicare Other | Admitting: Anesthesiology

## 2022-01-31 DIAGNOSIS — S42202A Unspecified fracture of upper end of left humerus, initial encounter for closed fracture: Secondary | ICD-10-CM | POA: Diagnosis not present

## 2022-01-31 DIAGNOSIS — E119 Type 2 diabetes mellitus without complications: Secondary | ICD-10-CM | POA: Insufficient documentation

## 2022-01-31 DIAGNOSIS — S42302A Unspecified fracture of shaft of humerus, left arm, initial encounter for closed fracture: Secondary | ICD-10-CM | POA: Diagnosis not present

## 2022-01-31 DIAGNOSIS — Z7984 Long term (current) use of oral hypoglycemic drugs: Secondary | ICD-10-CM

## 2022-01-31 DIAGNOSIS — S42332A Displaced oblique fracture of shaft of humerus, left arm, initial encounter for closed fracture: Secondary | ICD-10-CM

## 2022-01-31 DIAGNOSIS — Z6841 Body Mass Index (BMI) 40.0 and over, adult: Secondary | ICD-10-CM | POA: Diagnosis not present

## 2022-01-31 DIAGNOSIS — I10 Essential (primary) hypertension: Secondary | ICD-10-CM | POA: Insufficient documentation

## 2022-01-31 DIAGNOSIS — W010XXA Fall on same level from slipping, tripping and stumbling without subsequent striking against object, initial encounter: Secondary | ICD-10-CM | POA: Diagnosis not present

## 2022-01-31 DIAGNOSIS — E785 Hyperlipidemia, unspecified: Secondary | ICD-10-CM | POA: Insufficient documentation

## 2022-01-31 DIAGNOSIS — S42352A Displaced comminuted fracture of shaft of humerus, left arm, initial encounter for closed fracture: Secondary | ICD-10-CM | POA: Insufficient documentation

## 2022-01-31 DIAGNOSIS — G8918 Other acute postprocedural pain: Secondary | ICD-10-CM | POA: Diagnosis not present

## 2022-01-31 DIAGNOSIS — Z79899 Other long term (current) drug therapy: Secondary | ICD-10-CM | POA: Insufficient documentation

## 2022-01-31 HISTORY — DX: Essential (primary) hypertension: I10

## 2022-01-31 HISTORY — DX: Unspecified osteoarthritis, unspecified site: M19.90

## 2022-01-31 HISTORY — PX: HUMERUS IM NAIL: SHX1769

## 2022-01-31 LAB — CBC
HCT: 38.5 % (ref 36.0–46.0)
Hemoglobin: 12.9 g/dL (ref 12.0–15.0)
MCH: 30.4 pg (ref 26.0–34.0)
MCHC: 33.5 g/dL (ref 30.0–36.0)
MCV: 90.6 fL (ref 80.0–100.0)
Platelets: 368 10*3/uL (ref 150–400)
RBC: 4.25 MIL/uL (ref 3.87–5.11)
RDW: 12.1 % (ref 11.5–15.5)
WBC: 7.6 10*3/uL (ref 4.0–10.5)
nRBC: 0 % (ref 0.0–0.2)

## 2022-01-31 LAB — BASIC METABOLIC PANEL
Anion gap: 12 (ref 5–15)
BUN: 13 mg/dL (ref 8–23)
CO2: 22 mmol/L (ref 22–32)
Calcium: 10.4 mg/dL — ABNORMAL HIGH (ref 8.9–10.3)
Chloride: 105 mmol/L (ref 98–111)
Creatinine, Ser: 0.88 mg/dL (ref 0.44–1.00)
GFR, Estimated: >60 mL/min (ref >60)
Glucose, Bld: 148 mg/dL — ABNORMAL HIGH (ref 70–99)
Potassium: 3.9 mmol/L (ref 3.5–5.1)
Sodium: 139 mmol/L (ref 135–145)

## 2022-01-31 LAB — GLUCOSE, CAPILLARY
Glucose-Capillary: 150 mg/dL — ABNORMAL HIGH (ref 70–99)
Glucose-Capillary: 76 mg/dL (ref 70–99)

## 2022-01-31 SURGERY — INSERTION, INTRAMEDULLARY ROD, HUMERUS
Anesthesia: General | Laterality: Left

## 2022-01-31 MED ORDER — ACETAMINOPHEN 500 MG PO TABS
1000.0000 mg | ORAL_TABLET | Freq: Once | ORAL | Status: AC
  Administered 2022-01-31: 1000 mg via ORAL
  Filled 2022-01-31: qty 2

## 2022-01-31 MED ORDER — ROCURONIUM BROMIDE 10 MG/ML (PF) SYRINGE
PREFILLED_SYRINGE | INTRAVENOUS | Status: AC
Start: 2022-01-31 — End: ?
  Filled 2022-01-31: qty 10

## 2022-01-31 MED ORDER — ONDANSETRON HCL 4 MG/2ML IJ SOLN
INTRAMUSCULAR | Status: DC | PRN
  Administered 2022-01-31: 4 mg via INTRAVENOUS

## 2022-01-31 MED ORDER — LACTATED RINGERS IV SOLN
INTRAVENOUS | Status: DC
Start: 2022-01-31 — End: 2022-01-31

## 2022-01-31 MED ORDER — PROPOFOL 10 MG/ML IV BOLUS
INTRAVENOUS | Status: DC | PRN
  Administered 2022-01-31: 16 mg via INTRAVENOUS

## 2022-01-31 MED ORDER — SUCCINYLCHOLINE CHLORIDE 200 MG/10ML IV SOSY
PREFILLED_SYRINGE | INTRAVENOUS | Status: AC
  Filled 2022-01-31: qty 10

## 2022-01-31 MED ORDER — MIDAZOLAM HCL 2 MG/2ML IJ SOLN
INTRAMUSCULAR | Status: AC
  Filled 2022-01-31: qty 2

## 2022-01-31 MED ORDER — SUGAMMADEX SODIUM 200 MG/2ML IV SOLN
INTRAVENOUS | Status: DC | PRN
  Administered 2022-01-31: 245 mg via INTRAVENOUS

## 2022-01-31 MED ORDER — LIDOCAINE 2% (20 MG/ML) 5 ML SYRINGE
INTRAMUSCULAR | Status: DC | PRN
  Administered 2022-01-31: 40 mg via INTRAVENOUS

## 2022-01-31 MED ORDER — FENTANYL CITRATE (PF) 250 MCG/5ML IJ SOLN
INTRAMUSCULAR | Status: AC
  Filled 2022-01-31: qty 5

## 2022-01-31 MED ORDER — GABAPENTIN 300 MG PO CAPS
300.0000 mg | ORAL_CAPSULE | Freq: Once | ORAL | Status: AC
  Administered 2022-01-31: 300 mg via ORAL
  Filled 2022-01-31: qty 1

## 2022-01-31 MED ORDER — INSULIN ASPART 100 UNIT/ML IJ SOLN
0.0000 [IU] | INTRAMUSCULAR | Status: DC | PRN
  Administered 2022-01-31: 2 [IU] via SUBCUTANEOUS
  Filled 2022-01-31: qty 1

## 2022-01-31 MED ORDER — TRANEXAMIC ACID-NACL 1000-0.7 MG/100ML-% IV SOLN: 1000.0000 mg | INTRAVENOUS | Status: DC

## 2022-01-31 MED ORDER — ONDANSETRON HCL 4 MG/2ML IJ SOLN
INTRAMUSCULAR | Status: AC
  Filled 2022-01-31: qty 8

## 2022-01-31 MED ORDER — ORAL CARE MOUTH RINSE: 15.0000 mL | Freq: Once | OROMUCOSAL | Status: AC

## 2022-01-31 MED ORDER — LIDOCAINE 2% (20 MG/ML) 5 ML SYRINGE
INTRAMUSCULAR | Status: AC
Start: 2022-01-31 — End: ?
  Filled 2022-01-31: qty 10

## 2022-01-31 MED ORDER — BUPIVACAINE LIPOSOME 1.3 % IJ SUSP
INTRAMUSCULAR | Status: DC | PRN
  Administered 2022-01-31: 10 mL via PERINEURAL

## 2022-01-31 MED ORDER — PHENYLEPHRINE HCL-NACL 20-0.9 MG/250ML-% IV SOLN
INTRAVENOUS | Status: DC | PRN
  Administered 2022-01-31: 25 ug/min via INTRAVENOUS

## 2022-01-31 MED ORDER — CEFAZOLIN IN SODIUM CHLORIDE 3-0.9 GM/100ML-% IV SOLN
3.0000 g | INTRAVENOUS | Status: AC
  Administered 2022-01-31: 3 g via INTRAVENOUS
  Filled 2022-01-31: qty 100

## 2022-01-31 MED ORDER — FENTANYL CITRATE (PF) 250 MCG/5ML IJ SOLN
INTRAMUSCULAR | Status: DC | PRN
  Administered 2022-01-31: 50 ug via INTRAVENOUS

## 2022-01-31 MED ORDER — PHENYLEPHRINE HCL (PRESSORS) 10 MG/ML IV SOLN
INTRAVENOUS | Status: DC | PRN
  Administered 2022-01-31: 80 ug via INTRAVENOUS

## 2022-01-31 MED ORDER — MIDAZOLAM HCL 2 MG/2ML IJ SOLN
INTRAMUSCULAR | Status: AC
  Administered 2022-01-31: 1 mg via INTRAVENOUS
  Filled 2022-01-31: qty 2

## 2022-01-31 MED ORDER — ROCURONIUM BROMIDE 10 MG/ML (PF) SYRINGE
PREFILLED_SYRINGE | INTRAVENOUS | Status: DC | PRN
  Administered 2022-01-31: 70 mg via INTRAVENOUS

## 2022-01-31 MED ORDER — FENTANYL CITRATE (PF) 100 MCG/2ML IJ SOLN
INTRAMUSCULAR | Status: AC
  Administered 2022-01-31: 50 ug via INTRAVENOUS
  Filled 2022-01-31: qty 2

## 2022-01-31 MED ORDER — BUPIVACAINE HCL (PF) 0.5 % IJ SOLN
INTRAMUSCULAR | Status: DC | PRN
  Administered 2022-01-31: 15 mL via PERINEURAL

## 2022-01-31 MED ORDER — FENTANYL CITRATE (PF) 100 MCG/2ML IJ SOLN: 50.0000 ug | Freq: Once | INTRAMUSCULAR | Status: AC

## 2022-01-31 MED ORDER — MIDAZOLAM HCL 2 MG/2ML IJ SOLN: 1.0000 mg | Freq: Once | INTRAMUSCULAR | Status: AC

## 2022-01-31 MED ORDER — 0.9 % SODIUM CHLORIDE (POUR BTL) OPTIME
TOPICAL | Status: DC | PRN
  Administered 2022-01-31: 1000 mL

## 2022-01-31 MED ORDER — PROPOFOL 10 MG/ML IV BOLUS
INTRAVENOUS | Status: AC
  Filled 2022-01-31: qty 20

## 2022-01-31 MED ORDER — DEXAMETHASONE SODIUM PHOSPHATE 10 MG/ML IJ SOLN
INTRAMUSCULAR | Status: AC
  Filled 2022-01-31: qty 2

## 2022-01-31 MED ORDER — CHLORHEXIDINE GLUCONATE 0.12 % MT SOLN
15.0000 mL | Freq: Once | OROMUCOSAL | Status: AC
  Administered 2022-01-31: 15 mL via OROMUCOSAL
  Filled 2022-01-31: qty 15

## 2022-01-31 SURGICAL SUPPLY — 66 items
BAG COUNTER SPONGE SURGICOUNT (BAG) ×2 IMPLANT
BIT DRILL LNG GRA BRD PT 3.2MM (BIT) IMPLANT
BRUSH SCRUB EZ PLAIN DRY (MISCELLANEOUS) ×4 IMPLANT
COOLER ICEMAN CLASSIC (MISCELLANEOUS) ×1 IMPLANT
COVER SURGICAL LIGHT HANDLE (MISCELLANEOUS) ×4 IMPLANT
DRAPE C-ARM 42X72 X-RAY (DRAPES) ×2 IMPLANT
DRAPE C-ARMOR (DRAPES) ×1 IMPLANT
DRAPE IMP U-DRAPE 54X76 (DRAPES) ×2 IMPLANT
DRAPE INCISE IOBAN 66X45 STRL (DRAPES) ×2 IMPLANT
DRAPE ORTHO SPLIT 77X108 STRL (DRAPES) ×4
DRAPE SURG ORHT 6 SPLT 77X108 (DRAPES) IMPLANT
DRAPE U-SHAPE 47X51 STRL (DRAPES) ×2 IMPLANT
DRILL LONG GRAD BRAD PT 3.2MM (BIT) ×2
DRSG ADAPTIC 3X8 NADH LF (GAUZE/BANDAGES/DRESSINGS) ×2 IMPLANT
DRSG AQUACEL AG ADV 3.5X 6 (GAUZE/BANDAGES/DRESSINGS) ×2 IMPLANT
DRSG PAD ABDOMINAL 8X10 ST (GAUZE/BANDAGES/DRESSINGS) ×6 IMPLANT
ELECT REM PT RETURN 9FT ADLT (ELECTROSURGICAL)
ELECTRODE REM PT RTRN 9FT ADLT (ELECTROSURGICAL) IMPLANT
GAUZE SPONGE 4X4 12PLY STRL (GAUZE/BANDAGES/DRESSINGS) ×2 IMPLANT
GAUZE XEROFORM 1X8 LF (GAUZE/BANDAGES/DRESSINGS) ×1 IMPLANT
GLOVE BIO SURGEON STRL SZ7.5 (GLOVE) ×2 IMPLANT
GLOVE BIO SURGEON STRL SZ8 (GLOVE) ×2 IMPLANT
GLOVE BIOGEL PI IND STRL 7.5 (GLOVE) ×1 IMPLANT
GLOVE BIOGEL PI IND STRL 8 (GLOVE) ×1 IMPLANT
GLOVE BIOGEL PI INDICATOR 7.5 (GLOVE) ×1
GLOVE BIOGEL PI INDICATOR 8 (GLOVE) ×1
GLOVE SURG ORTHO LTX SZ7.5 (GLOVE) ×4 IMPLANT
GOWN STRL REUS W/ TWL LRG LVL3 (GOWN DISPOSABLE) ×2 IMPLANT
GOWN STRL REUS W/ TWL XL LVL3 (GOWN DISPOSABLE) ×1 IMPLANT
GOWN STRL REUS W/TWL LRG LVL3 (GOWN DISPOSABLE) ×4
GOWN STRL REUS W/TWL XL LVL3 (GOWN DISPOSABLE) ×2
GUIDE ROD BALL TIP 2.0 DISP (Rod) ×4 IMPLANT
GUIDEWIRE TROCAR TRIG 3.2 DISP (Pin) ×1 IMPLANT
KIT BASIN OR (CUSTOM PROCEDURE TRAY) ×2 IMPLANT
KIT TURNOVER KIT B (KITS) ×2 IMPLANT
MANIFOLD NEPTUNE II (INSTRUMENTS) ×2 IMPLANT
NAIL CANN IM AR TRIG 8/7X260 (Nail) ×1 IMPLANT
NS IRRIG 1000ML POUR BTL (IV SOLUTION) ×2 IMPLANT
PACK TOTAL JOINT (CUSTOM PROCEDURE TRAY) ×2 IMPLANT
PACK UNIVERSAL I (CUSTOM PROCEDURE TRAY) ×2 IMPLANT
PAD ARMBOARD 7.5X6 YLW CONV (MISCELLANEOUS) ×4 IMPLANT
PIN TIP THREADED GD 3.2MM (PIN) ×2
PIN TIP THREADED GUIDE 3.2 (PIN) IMPLANT
ROD GUIDE BALL TIP 2.0 DISP (Rod) IMPLANT
SCREW CORTICAL 4.0X24MM (Screw) ×2 IMPLANT
SCREW LOCK CANC ST TRIG 5X26 (Screw) ×1 IMPLANT
SCREW LOCK CANC ST TRIG 5X40 (Screw) ×1 IMPLANT
SCREW SELFTAP CAN 5.0X38 (Screw) ×1 IMPLANT
SLING ARM FOAM STRAP LRG (SOFTGOODS) ×1 IMPLANT
STAPLER VISISTAT 35W (STAPLE) ×2 IMPLANT
SUCTION FRAZIER HANDLE 10FR (MISCELLANEOUS) ×2
SUCTION TUBE FRAZIER 10FR DISP (MISCELLANEOUS) ×1 IMPLANT
SUT ETHILON 3 0 PS 1 (SUTURE) ×2 IMPLANT
SUT FIBERWIRE #2 38 REV NDL BL (SUTURE) ×2
SUT FIBERWIRE 2-0 18 17.9 3/8 (SUTURE)
SUT VIC AB 0 CT1 27 (SUTURE) ×4
SUT VIC AB 0 CT1 27XBRD ANBCTR (SUTURE) ×2 IMPLANT
SUT VIC AB 2-0 CT1 27 (SUTURE) ×6
SUT VIC AB 2-0 CT1 27XBRD (SUTURE) ×2 IMPLANT
SUT VIC AB 2-0 CT1 TAPERPNT 27 (SUTURE) IMPLANT
SUT VIC AB 2-0 CT3 27 (SUTURE) IMPLANT
SUTURE FIBERWR 2-0 18 17.9 3/8 (SUTURE) IMPLANT
SUTURE FIBERWR#2 38 REV NDL BL (SUTURE) IMPLANT
TRAY FOLEY MTR SLVR 16FR STAT (SET/KITS/TRAYS/PACK) IMPLANT
WATER STERILE IRR 1000ML POUR (IV SOLUTION) ×2 IMPLANT
YANKAUER SUCT BULB TIP NO VENT (SUCTIONS) ×2 IMPLANT

## 2022-01-31 NOTE — Discharge Instructions (Signed)
     Discharge Instructions    Attending Surgeon: Huel Cote, MD Office Phone Number: [phone]   Diagnosis and Procedures:    Surgeries Performed: Left humeral nailing  Discharge Plan:    Diet: Resume usual diet. Begin with light or bland foods.  Drink plenty of fluids.  Activity:  Keep sling and dressing in place until your follow up visit in Physical Therapy You are advised to go home directly from the hospital or surgical center. Restrict your activities.  GENERAL INSTRUCTIONS: 1.  Keep your surgical site elevated above your heart for at least 5-7 days or longer to prevent swelling. This will improve your comfort and your overall recovery following surgery.     2. Please call Dr. Serena Croissant office at [phone] with questions Monday-Friday during business hours. If no one answers, please leave a message and someone should get back to the patient within 24 hours. For emergencies please call 911 or proceed to the emergency room.   3. Patient to notify surgical team if experiences any of the following: Bowel/Bladder dysfunction, uncontrolled pain, nerve/muscle weakness, incision with increased drainage or redness, nausea/vomiting and Fever greater than 101.0 F.  Be alert for signs of infection including redness, streaking, odor, fever or chills. Be alert for excessive pain or bleeding and notify your surgeon immediately.  WOUND INSTRUCTIONS:   Leave your dressing/cast/splint in place until your post operative visit.  Keep it clean and dry.  Always keep the incision clean and dry until the staples/sutures are removed. If there is no drainage from the incision you should keep it open to air. If there is drainage from the incision you must keep it covered at all times until the drainage stops  Do not soak in a bath tub, hot tub, pool, lake or other body of water until 21 days after your surgery and your incision is completely dry and healed.  If you have removable sutures  (or staples) they must be removed 10-14 days (unless otherwise instructed) from the day of your surgery.     1)  Elevate the extremity as much as possible.  2)  Keep the dressing clean and dry.  3)  Please call us if the dressing becomes wet or dirty.  4)  If you are experiencing worsening pain or worsening swelling, please call.     MEDICATIONS: Resume all previous home medications at the previous prescribed dose and frequency unless otherwise noted Start taking the  pain medications on an as-needed basis as prescribed  Please taper down pain medication over the next week following surgery.  Ideally you should not require a refill of any narcotic pain medication.  Take pain medication with food to minimize nausea. In addition to the prescribed pain medication, you may take over-the-counter pain relievers such as Tylenol.  Do NOT take additional tylenol if your pain medication already has tylenol in it.  Aspirin 325mg  daily for four weeks.      FOLLOWUP INSTRUCTIONS: 1. Follow up at the Physical Therapy Clinic 3-4 days following surgery. This appointment should be scheduled unless other arrangements have been made.The Physical Therapy scheduling number is [phone] if an appointment has not already been arranged.  2. Contact Dr. [phone] office during office hours at [phone] or the practice after hours line at [phone] for non-emergencies. For medical emergencies call 911.   Discharge Location: Home

## 2022-01-31 NOTE — Anesthesia Preprocedure Evaluation (Addendum)
Anesthesia Evaluation  Patient identified by MRN, date of birth, ID band Patient awake    Reviewed: Allergy & Precautions, NPO status , Patient's Chart, lab work & pertinent test results, reviewed documented beta blocker date and time   Airway Mallampati: II  TM Distance: >3 FB Neck ROM: Full    Dental no notable dental hx. (+) Teeth Intact, Caps, Dental Advisory Given   Pulmonary neg pulmonary ROS,    Pulmonary exam normal breath sounds clear to auscultation       Cardiovascular hypertension, Pt. on medications and Pt. on home beta blockers Normal cardiovascular exam Rhythm:Regular Rate:Normal     Neuro/Psych negative neurological ROS  negative psych ROS   GI/Hepatic negative GI ROS, Neg liver ROS,   Endo/Other  diabetes, Well Controlled, Type 2, Oral Hypoglycemic AgentsMorbid obesityHyperlipidemia  Renal/GU negative Renal ROS  negative genitourinary   Musculoskeletal  (+) Arthritis , Osteoarthritis,  Left proximal humerus Fx   Abdominal (+) + obese,   Peds  Hematology negative hematology ROS (+)   Anesthesia Other Findings   Reproductive/Obstetrics                             Anesthesia Physical Anesthesia Plan  ASA: 3  Anesthesia Plan: General   Post-op Pain Management: Minimal or no pain anticipated and Regional block*   Induction:   PONV Risk Score and Plan: 4 or greater and Treatment may vary due to age or medical condition and Ondansetron  Airway Management Planned: Oral ETT  Additional Equipment: None  Intra-op Plan:   Post-operative Plan: Extubation in OR  Informed Consent: I have reviewed the patients History and Physical, chart, labs and discussed the procedure including the risks, benefits and alternatives for the proposed anesthesia with the patient or authorized representative who has indicated his/her understanding and acceptance.     Dental advisory  given  Plan Discussed with: CRNA and Anesthesiologist  Anesthesia Plan Comments:         Anesthesia Quick Evaluation

## 2022-01-31 NOTE — Anesthesia Procedure Notes (Signed)
Anesthesia Regional Block: Interscalene brachial plexus block   Pre-Anesthetic Checklist: , timeout performed,  Correct Patient, Correct Site, Correct Laterality,  Correct Procedure, Correct Position, site marked,  Risks and benefits discussed,  Surgical consent,  Pre-op evaluation,  At surgeon's request and post-op pain management  Laterality: Left  Prep: chloraprep       Needles:  Injection technique: Single-shot  Needle Type: Echogenic Stimulator Needle     Needle Length: 10cm  Needle Gauge: 21     Additional Needles:   Procedures:,,,, ultrasound used (permanent image in chart),,    Narrative:  Start time: 01/31/2022 2:19 PM End time: 01/31/2022 2:24 PM Injection made incrementally with aspirations every 5 mL.  Performed by: Personally  Anesthesiologist: Mal Amabile, MD  Additional Notes: Timeout performed. Patient sedated. Relevant anatomy ID'd using [place]. Incremental 2-71ml injection of LA with frequent aspiration. Patient tolerated procedure well.     Left Interscalene Block

## 2022-01-31 NOTE — Anesthesia Procedure Notes (Signed)
Procedure Name: Intubation Date/Time: 01/31/2022 3:45 PM  Performed by: Minerva Ends, CRNAPre-anesthesia Checklist: Patient identified, Emergency Drugs available, Suction available and Patient being monitored Patient Re-evaluated:Patient Re-evaluated prior to induction Oxygen Delivery Method: Circle system utilized Preoxygenation: Pre-oxygenation with 100% oxygen Induction Type: IV induction Ventilation: Mask ventilation without difficulty Laryngoscope Size: Mac and 3 Grade View: Grade I Tube type: Oral Tube size: 7.0 mm Number of attempts: 1 Airway Equipment and Method: Stylet and Oral airway Placement Confirmation: ETT inserted through vocal cords under direct vision, positive ETCO2 and breath sounds checked- equal and bilateral Secured at: 22 cm Tube secured with: Tape Dental Injury: Teeth and Oropharynx as per pre-operative assessment

## 2022-01-31 NOTE — Transfer of Care (Signed)
Immediate Anesthesia Transfer of Care Note  Patient: Patricia George  Procedure(s) Performed: LEFT INTRAMEDULLARY (IM) NAIL HUMERAL (Left)  Patient Location: PACU  Anesthesia Type:GA combined with regional for post-op pain  Level of Consciousness: awake, alert  and oriented  Airway & Oxygen Therapy: Patient Spontanous Breathing and Patient connected to nasal cannula oxygen  Post-op Assessment: Report given to RN and Post -op Vital signs reviewed and stable  Post vital signs: Reviewed and stable  Last Vitals:  Vitals Value Taken Time  BP 127/72 01/31/22 1727  Temp    Pulse    Resp 20 01/31/22 1729  SpO2    Vitals shown include unvalidated device data.  Last Pain:  Vitals:   01/31/22 1430  TempSrc:   PainSc: 0-No pain      Patients Stated Pain Goal: 0 (01/31/22 1300)  Complications: No notable events documented.

## 2022-01-31 NOTE — Op Note (Signed)
Date of Surgery: 01/31/2022  INDICATIONS: Patricia George is a 66 y.o.-year-old female with a left humeral shaft fracture.  The risks and benefits of operative management were discussed with her and she ultimately elected for operative repair.  The risk and benefits of the procedure were discussed in detail and documented in the pre-operative evaluation.   PREOPERATIVE DIAGNOSIS: 1.  Left displaced proximal metaphyseal humerus fracture  POSTOPERATIVE DIAGNOSIS: Same.  PROCEDURE: 1.  Left humeral nailing 2.  Left open rotator cuff repair  SURGEON: Benancio Deeds MD  ASSISTANT: Kerby Less, ATC  ANESTHESIA:  general + interscalene nerve block  IV FLUIDS AND URINE: See anesthesia record.  ANTIBIOTICS: Plan  ESTIMATED BLOOD LOSS: 25 mL.  IMPLANTS:  Implant Name Type Inv. Item Serial No. Manufacturer Lot No. LRB No. Used Action  TRIGEN HUMERAL NAIL    [employer] [id number] Left 1 Implanted  CANCELLOUS SCREW 40 Trauma   [employer] [id number] Left 1 Implanted  CANCELLOUS SCREW 38 Trauma   [employer] [id number] Left 1 Implanted  CANCELLOUS SCREW 26 Trauma   [employer] [id number] Left 1 Implanted  SCREW CORTICAL 4.0X24MM - [id number] Screw SCREW CORTICAL 4.0X24MM  [employer] [id number] Left 1 Implanted  SCREW CORTICAL 4.0X24MM - [id number] Screw SCREW CORTICAL 4.0X24MM  [employer] [id number] Left 1 Implanted    DRAINS: None  CULTURES: None  COMPLICATIONS: none  DESCRIPTION OF PROCEDURE:  The patient was identified in the preoperative holding area and the correct site was marked according universal protocol with nursing.  Anesthesia performed an interscalene nerve block.  The patient was subsequently taken back to the operating room.  Anesthesia was induced.  Antibiotics were given 1 hour prior to skin incision.  The patient was placed on in the supine position with an arm table and a bump under the left  scapula.  All bony prominences were padded.  The patient was subsequently prepped and draped in the usual sterile fashion.   Final timeout was performed.  A mini open rotator cuff approach was utilized.  This was done with a 4 cm incision from the anterior aspect of the acromion posterior along the lateral edge of the acromion.  Electrocautery was used to achieve hemostasis.  Metzenbaum scissors were used to dissect down to the level of the deltoid.  A midline split was used in the fatty raphae using Metzenbaum scissors.  Gelpi's were used to open the deltoid.  The subacromial bursa was then dissected and removed.  The rotator cuff was identified and a midline split was made in this. The pin was placed under direct fluoroscopic visualization right medial to the greater trochanter and central in the humeral head.  This was malleted into place.  Opening reamer was then used.  A ball-tipped guidewire was then placed across the fracture site with good reduction.  Using a true AP view of the shoulder the arm was then held in approximately 25 degrees of external rotation in order to achieve an anatomically neutral reduction. Size 10 reamer was used over the guidewire and a size 9 nail was chosen.  Length of the nail was measured off of the guidewire.  The nail was introduced.  With the arm held in the reduction position the nail was sunk with care not to be prior to the humeral head.  The proximal interlock screws were then placed through the jig.  A anterior 2 static screws were placed through  the previous mini open approach.  First drill was introduced and the screw length was measured off the drill.  The screw was then placed.  The posterior screw and additional percutaneous incision was utilized.  Snap was used to spread down to bone.  This was drilled and then screw was placed.  Distal interlocks were then placed.  This was done during perfect circle technique.  15 blade was used to incise through skin.  Staff was  used to spread down to bone.  Drill was then used bicortically.  Screw sizes were measured and then 2 screws were placed both in static fashion.  The jig was removed.  At this time the rotator cuff incision was identified.  This was closed in a side-to-side fashion with #2 FiberWire in an interrupted fashion with simple sutures x3.  The wounds were thoroughly irrigated.  Final fluoroscopy was confirmed neutral reduction on AP and laterals.  Wounds were closed in layers of 0 Vicryl 2-0 Vicryl and 3-0 nylon.  An Aquacel dressing was placed. All counts were correct at the end of the case. The patient was awoken and taken to the PACU without complication.      POSTOPERATIVE PLAN: She will be placed in a sling for a total of 2 weeks.  At that time we will plan to progress her activity as well.  We will perform suture removal at that time.  She will plan to be discharged home.  She will begin physical therapy at that time as well  Benancio Deeds, MD 5:26 PM

## 2022-01-31 NOTE — Brief Op Note (Signed)
   Brief Op Note  Date of Surgery: 01/31/2022  Preoperative Diagnosis: left proximal humerus fracture  Postoperative Diagnosis: same  Procedure: Procedure(s): LEFT INTRAMEDULLARY (IM) NAIL HUMERAL  Implants: Implant Name Type Inv. Item Serial No. Manufacturer Lot No. LRB No. Used Action  TRIGEN HUMERAL NAIL    [employer] [id number] Left 1 Implanted  CANCELLOUS SCREW 40 Trauma   [employer] [id number] Left 1 Implanted  CANCELLOUS SCREW 38 Trauma   [employer] [id number] Left 1 Implanted  CANCELLOUS SCREW 26 Trauma   [employer] [id number] Left 1 Implanted  SCREW CORTICAL 4.0X24MM - [id number] Screw SCREW CORTICAL 4.0X24MM  [facility] [employer] [id number] Left 1 Implanted  SCREW CORTICAL 4.0X24MM - [id number] Screw SCREW CORTICAL 4.0X24MM  [employer] [id number] Left 1 Implanted    Surgeons: Surgeon(s): Huel Cote, MD  Anesthesia: Regional    Estimated Blood Loss: See anesthesia record  Complications: None  Condition to PACU: Stable  Benancio Deeds, MD 01/31/2022 5:26 PM

## 2022-01-31 NOTE — Anesthesia Postprocedure Evaluation (Signed)
Anesthesia Post Note  Patient: Patricia George  Procedure(s) Performed: LEFT INTRAMEDULLARY (IM) NAIL HUMERAL (Left)     Patient location during evaluation: PACU Anesthesia Type: General and Regional Level of consciousness: awake Pain management: pain level controlled Vital Signs Assessment: post-procedure vital signs reviewed and stable Respiratory status: spontaneous breathing, nonlabored ventilation, respiratory function stable and patient connected to nasal cannula oxygen Cardiovascular status: blood pressure returned to baseline and stable Postop Assessment: no apparent nausea or vomiting Anesthetic complications: no   No notable events documented.  Last Vitals:  Vitals:   01/31/22 1742 01/31/22 1757  BP: 111/76 (!) 130/91  Pulse: 80 85  Resp: 16 20  Temp:  36.4 C  SpO2: 97% 97%    Last Pain:  Vitals:   01/31/22 1727  TempSrc:   PainSc: 0-No pain                 Shaketta Rill P Savio Albrecht

## 2022-01-31 NOTE — Interval H&P Note (Signed)
History and Physical Interval Note:  01/31/2022 1:26 PM  Patricia George  has presented today for surgery, with the diagnosis of left proximal humerus fracture.  The various methods of treatment have been discussed with the patient and family. After consideration of risks, benefits and other options for treatment, the patient has consented to  Procedure(s): LEFT INTRAMEDULLARY (IM) NAIL HUMERAL (Left) as a surgical intervention.  The patient's history has been reviewed, patient examined, no change in status, stable for surgery.  I have reviewed the patient's chart and labs.  Questions were answered to the patient's satisfaction.     Huel Cote

## 2022-02-01 ENCOUNTER — Encounter (HOSPITAL_COMMUNITY): Payer: Self-pay | Admitting: Orthopaedic Surgery

## 2022-02-01 ENCOUNTER — Other Ambulatory Visit: Payer: Self-pay | Admitting: Family Medicine

## 2022-02-01 DIAGNOSIS — E2839 Other primary ovarian failure: Secondary | ICD-10-CM

## 2022-02-03 ENCOUNTER — Encounter (HOSPITAL_COMMUNITY): Payer: Self-pay | Admitting: Orthopaedic Surgery

## 2022-02-13 ENCOUNTER — Ambulatory Visit (INDEPENDENT_AMBULATORY_CARE_PROVIDER_SITE_OTHER): Payer: Medicare Other | Admitting: Orthopaedic Surgery

## 2022-02-13 ENCOUNTER — Ambulatory Visit (INDEPENDENT_AMBULATORY_CARE_PROVIDER_SITE_OTHER): Payer: Medicare Other

## 2022-02-13 DIAGNOSIS — S42302A Unspecified fracture of shaft of humerus, left arm, initial encounter for closed fracture: Secondary | ICD-10-CM | POA: Diagnosis not present

## 2022-02-13 DIAGNOSIS — S42332A Displaced oblique fracture of shaft of humerus, left arm, initial encounter for closed fracture: Secondary | ICD-10-CM

## 2022-02-13 NOTE — Progress Notes (Signed)
Post Operative Evaluation    Procedure/Date of Surgery: Left humeral nailing 01/31/22  Interval History:   Presents today 2 weeks follow-up status post left humeral nailing.  She has been compliant with aspirin.  He has been in a sling postop.  Denies any numbness or weakness in the left arm.  Overall she is doing quite well has been able to move the elbow.  She overall feels that the arm is much more solid today.  She has been at her home living independently.   PMH/PSH/Family History/Social History/Meds/Allergies:    Past Medical History:  Diagnosis Date   Ankle fracture    Arthritis    Diabetes mellitus without complication (HCC)    Hypertension    Past Surgical History:  Procedure Laterality Date   ANKLE FRACTURE SURGERY Right    HUMERUS IM NAIL Left 01/31/2022   Procedure: LEFT INTRAMEDULLARY (IM) NAIL HUMERAL;  Surgeon: Huel Cote, MD;  Location: [facility];  Service: Orthopedics;  Laterality: Left;   TONSILLECTOMY     Social History   Socioeconomic History   Marital status: Divorced    Spouse name: Not on file   Number of children: Not on file   Years of education: Not on file   Highest education level: Not on file  Occupational History   Not on file  Tobacco Use   Smoking status: Never   Smokeless tobacco: Never  Substance and Sexual Activity   Alcohol use: Yes    Comment: occ   Drug use: Never   Sexual activity: Not on file  Other Topics Concern   Not on file  Social History Narrative   Not on file   Social Determinants of Health   Financial Resource Strain: Not on file  Food Insecurity: Not on file  Transportation Needs: Not on file  Physical Activity: Not on file  Stress: Not on file  Social Connections: Not on file   No family history on file. Allergies  Allergen Reactions   Atorvastatin     Other reaction(s): myalgias, abd pain   Lisinopril     Other reaction(s): cough   Losartan Potassium-Hctz     Other  reaction(s): cough   Simvastatin Other (See Comments)    Myalgias   Current Outpatient Medications  Medication Sig Dispense Refill   Ascorbic Acid (VITAMIN C) 1000 MG tablet Take 1,000 mg by mouth daily.     aspirin EC 325 MG tablet Take 1 tablet (325 mg total) by mouth daily. (Patient taking differently: Take 81 mg by mouth 2 (two) times daily.) 30 tablet 0   Calcium Carb-Cholecalciferol (CALCIUM 600+D) 600-20 MG-MCG TABS Take 1 tablet by mouth daily.     Cholecalciferol (VITAMIN D3) 50 MCG (2000 UT) capsule Take 2,000 Units by mouth daily.     COVID-19 mRNA Vac-TriS, Pfizer, (PFIZER-BIONT COVID-19 VAC-TRIS) SUSP injection Inject into the muscle. 0.3 mL 0   ferrous sulfate 324 MG TBEC Take 324 mg by mouth daily with breakfast.     metFORMIN (GLUCOPHAGE-XR) 750 MG 24 hr tablet Take 1,500 mg by mouth daily.     metoprolol succinate (TOPROL-XL) 50 MG 24 hr tablet Take 50 mg by mouth daily.     Multiple Vitamins-Minerals (MULTIVITAMIN WITH MINERALS) tablet Take 1 tablet by mouth daily. Woman's     Omega-3 Fatty Acids (FISH  OIL PO) Take 900 mg by mouth daily. DHA/EPA     oxyCODONE (OXY IR/ROXICODONE) 5 MG immediate release tablet Take 1 tablet (5 mg total) by mouth every 4 (four) hours as needed (severe pain). 20 tablet 0   pravastatin (PRAVACHOL) 40 MG tablet Take 40 mg by mouth daily.     sertraline (ZOLOFT) 50 MG tablet Take 50 mg by mouth daily.     traMADol (ULTRAM) 50 MG tablet Take 1 tablet (50 mg total) by mouth every 8 (eight) hours as needed for severe pain. 11 tablet 0   No current facility-administered medications for this visit.   No results found.  Review of Systems:   A ROS was performed including pertinent positives and negatives as documented in the HPI.   Musculoskeletal Exam:    There were no vitals taken for this visit.  Left shoulder incision is clean dry intact without erythema or drainage.  Distal incisions are also well-healing.  She is able to flex and extend at  the left elbow.  She is able to actively forward elevate the left arm to approximately 20 degrees.  Is able to fire EPL as well as wrist extensors.  2+ radial pulse.  Imaging:    X-ray humerus 2 views 3 views: Status post left humeral nailing without evidence of complication  I personally reviewed and interpreted the radiographs.   Assessment:   66 year old female who is 2 weeks status post left humeral nailing overall doing very well.  At this time she will begin physical therapy.  I would like her to work on active as well as active assisted range of motion as tolerated about the left shoulder.  She will continue to work on this.  She will be in the sling only as needed at this time.  Plan :    -Return to clinic in 4 weeks for reassessment      I personally saw and evaluated the patient, and participated in the management and treatment plan.  Huel Cote, MD Attending Physician, Orthopedic Surgery  This document was dictated using Dragon voice recognition software. A reasonable attempt at proof reading has been made to minimize errors.

## 2022-02-16 ENCOUNTER — Ambulatory Visit (HOSPITAL_BASED_OUTPATIENT_CLINIC_OR_DEPARTMENT_OTHER): Payer: Medicare Other | Attending: Orthopaedic Surgery | Admitting: Physical Therapy

## 2022-02-16 ENCOUNTER — Other Ambulatory Visit: Payer: Self-pay

## 2022-02-16 ENCOUNTER — Encounter (HOSPITAL_BASED_OUTPATIENT_CLINIC_OR_DEPARTMENT_OTHER): Payer: Self-pay | Admitting: Physical Therapy

## 2022-02-16 DIAGNOSIS — M6281 Muscle weakness (generalized): Secondary | ICD-10-CM | POA: Insufficient documentation

## 2022-02-16 DIAGNOSIS — M25612 Stiffness of left shoulder, not elsewhere classified: Secondary | ICD-10-CM | POA: Diagnosis not present

## 2022-02-16 DIAGNOSIS — M79622 Pain in left upper arm: Secondary | ICD-10-CM

## 2022-02-16 DIAGNOSIS — X58XXXA Exposure to other specified factors, initial encounter: Secondary | ICD-10-CM | POA: Diagnosis not present

## 2022-02-16 DIAGNOSIS — S42332A Displaced oblique fracture of shaft of humerus, left arm, initial encounter for closed fracture: Secondary | ICD-10-CM | POA: Insufficient documentation

## 2022-02-16 NOTE — Therapy (Signed)
OUTPATIENT PHYSICAL THERAPY SHOULDER EVALUATION   Patient Name: Patricia George MRN: [id number] DOB:15-Jul-1956, 66 y.o., female Today's Date: 02/17/2022   PT End of Session - 02/17/22 1215     Visit Number 1    Number of Visits 21    Date for PT Re-Evaluation 04/28/22    Authorization Type [employer] MCR    Progress Note Due on Visit 10    PT Start Time 1515    PT Stop Time 1600    PT Time Calculation (min) 45 min    Activity Tolerance Patient tolerated treatment well    Behavior During Therapy WFL for tasks assessed/performed             Past Medical History:  Diagnosis Date   Ankle fracture    Arthritis    Diabetes mellitus without complication (HCC)    Hypertension    Past Surgical History:  Procedure Laterality Date   ANKLE FRACTURE SURGERY Right    HUMERUS IM NAIL Left 01/31/2022   Procedure: LEFT INTRAMEDULLARY (IM) NAIL HUMERAL;  Surgeon: Huel Cote, MD;  Location: [facility];  Service: Orthopedics;  Laterality: Left;   TONSILLECTOMY     Patient Active Problem List   Diagnosis Date Noted   Closed displaced oblique fracture of shaft of left humerus     PCP: Sigmund Hazel, MD  REFERRING PROVIDER: Huel Cote, MD  REFERRING DIAG: [phone] (ICD-10-CM) - Closed displaced oblique fracture of shaft of left humerus, initial encounter  THERAPY DIAG:  Pain in left upper arm  Muscle weakness (generalized)  Stiffness of left shoulder, not elsewhere classified  Rationale for Evaluation and Treatment Rehabilitation  ONSET DATE: DOS 01/31/22, injury 01/21/22  SUBJECTIVE:                                                                                                                                                                                      SUBJECTIVE STATEMENT: I fell over an ottomon at home. I feel like the arm is doing great. I am doing everything now by myself.   PERTINENT HISTORY: Arthritis, DM  PAIN:  Are you having pain? Yes: NPRS scale:  minimal/10 Pain location: Left humerus, occasional pain into forearm and hand Pain description: sometimes sharp/stabbing Aggravating factors: lifting too hard, reaching away Relieving factors: rest  PRECAUTIONS: As tolerated  WEIGHT BEARING RESTRICTIONS No  FALLS:  Has patient fallen in last 6 months? No  LIVING ENVIRONMENT: Lives independently  OCCUPATION: Not working  PLOF: Independent  PATIENT GOALS get back to where I was, knitting  OBJECTIVE:   DIAGNOSTIC FINDINGS:  Well placed humeral rod, healing well  PATIENT SURVEYS:  UEFS 17/80  COGNITION:  Overall cognitive status: Within functional limits for tasks assessed     SENSATION: Feels like she has poor control of the arm  POSTURE: Rounded Left shoulder as expected  UPPER EXTREMITY ROM:   Active ROM Left eval  Shoulder flexion 60  Shoulder extension 48  Shoulder abduction 46  Shoulder adduction   Shoulder internal rotation   Shoulder external rotation   (Blank rows = not tested)  PROM  Left 7/6  Shoulder flexion 90+ empty end feel  Shoulder extension   Shoulder abduction   Shoulder adduction   Shoulder internal rotation   Shoulder external rotation   (Blank rows = not tested)     PALPATION:  Significant tenderness to light palpation   TODAY'S TREATMENT:  EVAL Scap retraction Elbow extension Putty squeezes Wrist flexion/ext MANUAL: passive flexion ROM    PATIENT EDUCATION: Education details: Teacher, music of condition, POC, HEP, exercise form/rationale  Person educated: Patient Education method: Explanation, Demonstration, Tactile cues, Verbal cues, and Handouts Education comprehension: verbalized understanding, returned demonstration, verbal cues required, tactile cues required, and needs further education   HOME EXERCISE PROGRAM: [id number]  ASSESSMENT:  CLINICAL IMPRESSION: Patient is a 66 y.o. F who was seen today for physical therapy evaluation and treatment for placement of  Lt humeral IM nail following fall resulting in oblique fracture. Pt had significant pain in PROM and empty end feel just above 90 deg. Will plan on 1/week for the next 2 weeks to allow for more healing prior to pushing ROM.     OBJECTIVE IMPAIRMENTS decreased activity tolerance, decreased ROM, decreased strength, increased edema, impaired flexibility, impaired UE functional use, improper body mechanics, postural dysfunction, and pain.   ACTIVITY LIMITATIONS carrying, lifting, sleeping, bathing, dressing, reach over head, and hygiene/grooming  PARTICIPATION LIMITATIONS: meal prep, cleaning, laundry, driving, and community activity  PERSONAL FACTORS  DM  are also affecting patient's functional outcome.   REHAB POTENTIAL: Good  CLINICAL DECISION MAKING: Stable/uncomplicated  EVALUATION COMPLEXITY: Low   GOALS: Goals reviewed with patient? Yes  SHORT TERM GOALS: Target date: 03/10/22  Passive shoulder flexion to 90 without increase in pain Baseline: Goal status: INITIAL  2.  Independent in HEP for arm/hand to reduce edema Baseline:  Goal status: INITIAL   LONG TERM GOALS: Target date:04/28/22  Pt will be able to knit without limitation by pain Baseline:  Goal status: INITIAL  2.  Able to perform all self care dressing, feeding, grooming; pain <=2/10  Baseline:  Goal status: INITIAL  3.  Able to demo good posture through shoulder in reaching and lifting light objects overhead Baseline:  Goal status: INITIAL  4.  Demo 75% strength via dynamometry testing to Lt UE Baseline:  Goal status: INITIAL    PLAN: PT FREQUENCY: 1-2x/week  PT DURATION: 10 weeks  PLANNED INTERVENTIONS: Therapeutic exercises, Therapeutic activity, Neuromuscular re-education, Patient/Family education, Joint mobilization, Aquatic Therapy, Dry Needling, Electrical stimulation, Spinal mobilization, Cryotherapy, Moist heat, Taping, and Manual therapy  PLAN FOR NEXT SESSION: continue PROM, edema  management, periscap activation   Tajha Sammarco C. Sacoya Mcgourty PT, DPT 02/17/22 12:23 PM

## 2022-02-21 ENCOUNTER — Ambulatory Visit (HOSPITAL_BASED_OUTPATIENT_CLINIC_OR_DEPARTMENT_OTHER): Payer: Medicare Other | Admitting: Rehabilitative and Restorative Service Providers"

## 2022-02-21 ENCOUNTER — Encounter (HOSPITAL_BASED_OUTPATIENT_CLINIC_OR_DEPARTMENT_OTHER): Payer: Self-pay | Admitting: Rehabilitative and Restorative Service Providers"

## 2022-02-21 DIAGNOSIS — M6281 Muscle weakness (generalized): Secondary | ICD-10-CM | POA: Diagnosis not present

## 2022-02-21 DIAGNOSIS — S42332A Displaced oblique fracture of shaft of humerus, left arm, initial encounter for closed fracture: Secondary | ICD-10-CM | POA: Diagnosis not present

## 2022-02-21 DIAGNOSIS — M25612 Stiffness of left shoulder, not elsewhere classified: Secondary | ICD-10-CM | POA: Diagnosis not present

## 2022-02-21 DIAGNOSIS — M79622 Pain in left upper arm: Secondary | ICD-10-CM

## 2022-02-21 NOTE — Therapy (Signed)
OUTPATIENT PHYSICAL THERAPY SHOULDER Treatment   Patient Name: Patricia George MRN: [id number] DOB:May 11, 1956, 66 y.o., female Today's Date: 02/21/2022   PT End of Session - 02/21/22 1321     Visit Number 2    Number of Visits 21    Date for PT Re-Evaluation 04/28/22    Authorization Type UHC MCR    Progress Note Due on Visit 10    PT Start Time 1230    PT Stop Time 1318    PT Time Calculation (min) 48 min    Activity Tolerance Patient tolerated treatment well;No increased pain    Behavior During Therapy WFL for tasks assessed/performed              Past Medical History:  Diagnosis Date   Ankle fracture    Arthritis    Diabetes mellitus without complication ([place])    Hypertension    Past Surgical History:  Procedure Laterality Date   ANKLE FRACTURE SURGERY Right    HUMERUS IM NAIL Left 01/31/2022   Procedure: LEFT INTRAMEDULLARY (IM) NAIL HUMERAL;  Surgeon: Vanetta Mulders, MD;  Location: [place];  Service: Orthopedics;  Laterality: Left;   TONSILLECTOMY     Patient Active Problem List   Diagnosis Date Noted   Closed displaced oblique fracture of shaft of left humerus     PCP: Kathyrn Lass, MD  REFERRING PROVIDER: Vanetta Mulders, MD  REFERRING DIAG: [phone] (ICD-10-CM) - Closed displaced oblique fracture of shaft of left humerus, initial encounter  THERAPY DIAG:  Pain in left upper arm  Muscle weakness (generalized)  Stiffness of left shoulder, not elsewhere classified  Rationale for Evaluation and Treatment Rehabilitation  ONSET DATE: DOS 01/31/22, injury 01/21/22  SUBJECTIVE:                                                                                                                                                                                      SUBJECTIVE STATEMENT: The pain wakes me up at night. Still mostly sleep in the recliner   PERTINENT HISTORY: Arthritis, DM  PAIN:  Are you having pain? Yes: NPRS scale: 3/10 Pain location: Left  humerus, occasional pain into forearm and hand Pain description: sometimes sharp/stabbing Aggravating factors: lifting too hard, reaching away Relieving factors: rest  PRECAUTIONS: As tolerated  WEIGHT BEARING RESTRICTIONS No  FALLS:  Has patient fallen in last 6 months? No  LIVING ENVIRONMENT: Lives independently  OCCUPATION: Not working  PLOF: Independent  PATIENT GOALS get back to where I was, knitting  OBJECTIVE:   DIAGNOSTIC FINDINGS:  Well placed humeral rod, healing well  PATIENT SURVEYS:  UEFS 17/80  COGNITION:  Overall cognitive status: Within functional limits  for tasks assessed     SENSATION: Feels like she has poor control of the arm  POSTURE: Rounded Left shoulder as expected  UPPER EXTREMITY ROM:   Active ROM Left eval  Shoulder flexion 60  Shoulder extension 48  Shoulder abduction 46  Shoulder adduction   Shoulder internal rotation   Shoulder external rotation   (Blank rows = not tested)  PROM  Left 7/6  Shoulder flexion 90+ empty end feel  Shoulder extension   Shoulder abduction   Shoulder adduction   Shoulder internal rotation   Shoulder external rotation   (Blank rows = not tested)     PALPATION:  Significant tenderness to light palpation   TODAY'S TREATMENT:  [facility] Adult PT Treatment:                                                DATE: 02/21/22 Therapeutic Exercise: Reviewed HEP Manual Therapy: Passive shoulder PROM flexion, scaption to tolerance approx 100 degrees each, IR/ER to tolerance with pt more limited with IR stating "tightness felt but not pain," elbow flexion/ext PROM, forearm pronation/supination PROM, wrist flexion/ext/radial deviation/ulnar deviation PROM with varying degrees of elbow flex/ext. All to pt tolerance.   EVAL Scap retraction Elbow extension Putty squeezes Wrist flexion/ext MANUAL: passive flexion ROM    PATIENT EDUCATION: Education details: Anatomy of condition, POC, HEP, exercise  form/rationale  Person educated: Patient Education method: Explanation, Demonstration, Tactile cues, Verbal cues, and Handouts Education comprehension: verbalized understanding, returned demonstration, verbal cues required, tactile cues required, and needs further education   HOME EXERCISE PROGRAM: [id number]  ASSESSMENT:  CLINICAL IMPRESSION: Patient is a 66 y.o. F who was seen today for treatment for placement of Lt humeral IM nail following fall resulting in oblique fracture. Pt demonstrated improvement in ability to perform PROM increased range this visit than at eval prior to "discomfort but not pain." She reports using her arm at home for minimal activities when she just "catches" herself doing things. No pain at tx end.     OBJECTIVE IMPAIRMENTS decreased activity tolerance, decreased ROM, decreased strength, increased edema, impaired flexibility, impaired UE functional use, improper body mechanics, postural dysfunction, and pain.   ACTIVITY LIMITATIONS carrying, lifting, sleeping, bathing, dressing, reach over head, and hygiene/grooming  PARTICIPATION LIMITATIONS: meal prep, cleaning, laundry, driving, and community activity  PERSONAL FACTORS  DM  are also affecting patient's functional outcome.   REHAB POTENTIAL: Good  CLINICAL DECISION MAKING: Stable/uncomplicated  EVALUATION COMPLEXITY: Low   GOALS: Goals reviewed with patient? Yes  SHORT TERM GOALS: Target date: 03/10/22  Passive shoulder flexion to 90 without increase in pain Baseline: Goal status: INITIAL  2.  Independent in HEP for arm/hand to reduce edema Baseline:  Goal status: INITIAL   LONG TERM GOALS: Target date:04/28/22  Pt will be able to knit without limitation by pain Baseline:  Goal status: INITIAL  2.  Able to perform all self care dressing, feeding, grooming; pain <=2/10  Baseline:  Goal status: INITIAL  3.  Able to demo good posture through shoulder in reaching and lifting light objects  overhead Baseline:  Goal status: INITIAL  4.  Demo 75% strength via dynamometry testing to Lt UE Baseline:  Goal status: INITIAL    PLAN: PT FREQUENCY: 1-2x/week  PT DURATION: 10 weeks  PLANNED INTERVENTIONS: Therapeutic exercises, Therapeutic activity, Neuromuscular re-education, Patient/Family education, Joint mobilization, Aquatic  Therapy, Dry Needling, Electrical stimulation, Spinal mobilization, Cryotherapy, Moist heat, Taping, and Manual therapy  PLAN FOR NEXT SESSION: continue PROM, edema management, periscap activation

## 2022-02-28 ENCOUNTER — Encounter (HOSPITAL_BASED_OUTPATIENT_CLINIC_OR_DEPARTMENT_OTHER): Payer: Self-pay | Admitting: Physical Therapy

## 2022-02-28 ENCOUNTER — Ambulatory Visit (HOSPITAL_BASED_OUTPATIENT_CLINIC_OR_DEPARTMENT_OTHER): Payer: Medicare Other | Admitting: Physical Therapy

## 2022-02-28 DIAGNOSIS — M6281 Muscle weakness (generalized): Secondary | ICD-10-CM

## 2022-02-28 DIAGNOSIS — S42332A Displaced oblique fracture of shaft of humerus, left arm, initial encounter for closed fracture: Secondary | ICD-10-CM | POA: Diagnosis not present

## 2022-02-28 DIAGNOSIS — M25612 Stiffness of left shoulder, not elsewhere classified: Secondary | ICD-10-CM

## 2022-02-28 DIAGNOSIS — M79622 Pain in left upper arm: Secondary | ICD-10-CM

## 2022-02-28 NOTE — Therapy (Signed)
OUTPATIENT PHYSICAL THERAPY SHOULDER Treatment   Patient Name: Patricia George MRN: [id number] DOB:Jul 01, 1956, 66 y.o., female Today's Date: 02/28/2022   PT End of Session - 02/28/22 1104     Visit Number 3    Number of Visits 21    Date for PT Re-Evaluation 04/28/22    Authorization Type [employer] MCR    PT Start Time 1103    PT Stop Time 1142    PT Time Calculation (min) 39 min    Activity Tolerance Patient tolerated treatment well    Behavior During Therapy WFL for tasks assessed/performed              Past Medical History:  Diagnosis Date   Ankle fracture    Arthritis    Diabetes mellitus without complication ([place])    Hypertension    Past Surgical History:  Procedure Laterality Date   ANKLE FRACTURE SURGERY Right    HUMERUS IM NAIL Left 01/31/2022   Procedure: LEFT INTRAMEDULLARY (IM) NAIL HUMERAL;  Surgeon: Vanetta Mulders, MD;  Location: [place];  Service: Orthopedics;  Laterality: Left;   TONSILLECTOMY     Patient Active Problem List   Diagnosis Date Noted   Closed displaced oblique fracture of shaft of left humerus     PCP: Kathyrn Lass, MD  REFERRING PROVIDER: Vanetta Mulders, MD  REFERRING DIAG: [phone] (ICD-10-CM) - Closed displaced oblique fracture of shaft of left humerus, initial encounter  THERAPY DIAG:  Pain in left upper arm  Muscle weakness (generalized)  Stiffness of left shoulder, not elsewhere classified  Rationale for Evaluation and Treatment Rehabilitation  ONSET DATE: DOS 01/31/22, injury 01/21/22  SUBJECTIVE:                                                                                                                                                                                      SUBJECTIVE STATEMENT: Most of the time sleeping in recliner. Don't usually make it all night when I try to lay in the bed.   PERTINENT HISTORY: Arthritis, DM  PAIN:  Are you having pain? Yes: NPRS scale: 1/10 Pain location: Left humerus, occasional  pain into forearm and hand Pain description: sometimes sharp/stabbing Aggravating factors: lifting too hard, reaching away Relieving factors: rest  PRECAUTIONS: As tolerated  WEIGHT BEARING RESTRICTIONS No  FALLS:  Has patient fallen in last 6 months? No  LIVING ENVIRONMENT: Lives independently  OCCUPATION: Not working  PLOF: Independent  PATIENT GOALS get back to where I was, knitting  OBJECTIVE:   DIAGNOSTIC FINDINGS:  Well placed humeral rod, healing well  PATIENT SURVEYS:  UEFS 17/80  COGNITION:  Overall cognitive status: Within functional limits for tasks assessed  SENSATION: Feels like she has poor control of the arm  POSTURE: Rounded Left shoulder as expected  UPPER EXTREMITY ROM:   Active ROM Left eval LEFT 7/18  Shoulder flexion 60 82  Shoulder extension 48   Shoulder abduction 46   Shoulder adduction    Shoulder internal rotation    Shoulder external rotation    (Blank rows = not tested)  PROM  Left 7/6  Shoulder flexion 90+ empty end feel  Shoulder extension   Shoulder abduction   Shoulder adduction   Shoulder internal rotation   Shoulder external rotation   (Blank rows = not tested)     PALPATION:  Significant tenderness to light palpation   TODAY'S TREATMENT:  [facility] Adult PT Treatment:                                                DATE: 02/28/22 MANUAL: PROM Lt shoulder- empty end feel with pain at 100-110; STM Rt upper trap & levator; passive elbow & wrist motion; scar tissue mob at distal humerus and anterior elbow  Pulleys,: fwd flexion from standing  Standing scap retraction  Seated table slides: flexion, abduction/adduction from midline alternating UEs Seated biceps curls supinated, alt Rt/Lt  [facility] Adult PT Treatment:                                                DATE: 02/21/22 Therapeutic Exercise: Reviewed HEP Manual Therapy: Passive shoulder PROM flexion, scaption to tolerance approx 100 degrees each, IR/ER to  tolerance with pt more limited with IR stating "tightness felt but not pain," elbow flexion/ext PROM, forearm pronation/supination PROM, wrist flexion/ext/radial deviation/ulnar deviation PROM with varying degrees of elbow flex/ext. All to pt tolerance.   EVAL Scap retraction Elbow extension Putty squeezes Wrist flexion/ext MANUAL: passive flexion ROM    PATIENT EDUCATION: Education details: Anatomy of condition, POC, HEP, exercise form/rationale  Person educated: Patient Education method: Explanation, Demonstration, Tactile cues, Verbal cues, and Handouts Education comprehension: verbalized understanding, returned demonstration, verbal cues required, tactile cues required, and needs further education   HOME EXERCISE PROGRAM: [id number]  ASSESSMENT:  CLINICAL IMPRESSION: Empty end feels with pain are noted at higher ranges of motion. Gentle progression of muscle activation today to reduce overuse of upper trap that is limiting elevation.     OBJECTIVE IMPAIRMENTS decreased activity tolerance, decreased ROM, decreased strength, increased edema, impaired flexibility, impaired UE functional use, improper body mechanics, postural dysfunction, and pain.   ACTIVITY LIMITATIONS carrying, lifting, sleeping, bathing, dressing, reach over head, and hygiene/grooming  PARTICIPATION LIMITATIONS: meal prep, cleaning, laundry, driving, and community activity  PERSONAL FACTORS  DM  are also affecting patient's functional outcome.   REHAB POTENTIAL: Good  CLINICAL DECISION MAKING: Stable/uncomplicated  EVALUATION COMPLEXITY: Low   GOALS: Goals reviewed with patient? Yes  SHORT TERM GOALS: Target date: 03/10/22  Passive shoulder flexion to 90 without increase in pain Baseline: Goal status: INITIAL  2.  Independent in HEP for arm/hand to reduce edema Baseline:  Goal status: INITIAL   LONG TERM GOALS: Target date:04/28/22  Pt will be able to knit without limitation by  pain Baseline:  Goal status: INITIAL  2.  Able to perform all self care dressing, feeding, grooming; pain <=  2/10  Baseline:  Goal status: INITIAL  3.  Able to demo good posture through shoulder in reaching and lifting light objects overhead Baseline:  Goal status: INITIAL  4.  Demo 75% strength via dynamometry testing to Lt UE Baseline:  Goal status: INITIAL    PLAN: PT FREQUENCY: 1-2x/week  PT DURATION: 10 weeks  PLANNED INTERVENTIONS: Therapeutic exercises, Therapeutic activity, Neuromuscular re-education, Patient/Family education, Joint mobilization, Aquatic Therapy, Dry Needling, Electrical stimulation, Spinal mobilization, Cryotherapy, Moist heat, Taping, and Manual therapy  PLAN FOR NEXT SESSION: continue PROM, edema management, periscap activation   Valiant Dills C. Avereigh Spainhower PT, DPT 02/28/22 11:42 AM

## 2022-03-07 ENCOUNTER — Ambulatory Visit (HOSPITAL_BASED_OUTPATIENT_CLINIC_OR_DEPARTMENT_OTHER): Payer: Medicare Other | Admitting: Physical Therapy

## 2022-03-07 ENCOUNTER — Encounter (HOSPITAL_BASED_OUTPATIENT_CLINIC_OR_DEPARTMENT_OTHER): Payer: Self-pay | Admitting: Physical Therapy

## 2022-03-07 DIAGNOSIS — S42332A Displaced oblique fracture of shaft of humerus, left arm, initial encounter for closed fracture: Secondary | ICD-10-CM | POA: Diagnosis not present

## 2022-03-07 DIAGNOSIS — M6281 Muscle weakness (generalized): Secondary | ICD-10-CM

## 2022-03-07 DIAGNOSIS — M79622 Pain in left upper arm: Secondary | ICD-10-CM

## 2022-03-07 DIAGNOSIS — M25612 Stiffness of left shoulder, not elsewhere classified: Secondary | ICD-10-CM

## 2022-03-07 NOTE — Therapy (Signed)
OUTPATIENT PHYSICAL THERAPY SHOULDER Treatment   Patient Name: Patricia George MRN: [id number] DOB:1955-12-07, 66 y.o., female Today's Date: 03/07/2022   PT End of Session - 03/07/22 1326     Visit Number 4    Number of Visits 21    Date for PT Re-Evaluation 04/28/22    Authorization Type [employer] MCR    Progress Note Due on Visit 10    PT Start Time 1303    PT Stop Time 1343    PT Time Calculation (min) 40 min    Activity Tolerance Patient tolerated treatment well    Behavior During Therapy WFL for tasks assessed/performed               Past Medical History:  Diagnosis Date   Ankle fracture    Arthritis    Diabetes mellitus without complication ([place])    Hypertension    Past Surgical History:  Procedure Laterality Date   ANKLE FRACTURE SURGERY Right    HUMERUS IM NAIL Left 01/31/2022   Procedure: LEFT INTRAMEDULLARY (IM) NAIL HUMERAL;  Surgeon: Vanetta Mulders, MD;  Location: [place];  Service: Orthopedics;  Laterality: Left;   TONSILLECTOMY     Patient Active Problem List   Diagnosis Date Noted   Closed displaced oblique fracture of shaft of left humerus     PCP: Kathyrn Lass, MD  REFERRING PROVIDER: Vanetta Mulders, MD  REFERRING DIAG: [phone] (ICD-10-CM) - Closed displaced oblique fracture of shaft of left humerus, initial encounter  THERAPY DIAG:  Pain in left upper arm  Muscle weakness (generalized)  Stiffness of left shoulder, not elsewhere classified  Rationale for Evaluation and Treatment Rehabilitation  ONSET DATE: DOS 01/31/22, injury 01/21/22  SUBJECTIVE:                                                                                                                                                                                      SUBJECTIVE STATEMENT:   My arm is "here". I'm going back and forth now going from the bed to the recliner, I was in the bed last night and my arm really got my attention. It tends to get really painful and catch when I  reach overhead, sometimes at the break and sometimes in my shoulder. Getting better overall. Was able to get out and get my own groceries today for the first time in a long time.   PERTINENT HISTORY: Arthritis, DM  PAIN:  Are you having pain? Yes: NPRS scale: 3/10 Pain location: Left humerus, occasional pain into forearm and hand Pain description: achey/crampy feeling Aggravating factors: lifting too hard, reaching away Relieving factors: rest  PRECAUTIONS: As tolerated  WEIGHT BEARING RESTRICTIONS  No  FALLS:  Has patient fallen in last 6 months? No  LIVING ENVIRONMENT: Lives independently  OCCUPATION: Not working  PLOF: Independent  PATIENT GOALS get back to where I was, knitting  OBJECTIVE:   DIAGNOSTIC FINDINGS:  Well placed humeral rod, healing well  PATIENT SURVEYS:  UEFS 17/80  COGNITION:  Overall cognitive status: Within functional limits for tasks assessed     SENSATION: Feels like she has poor control of the arm  POSTURE: Rounded Left shoulder as expected  UPPER EXTREMITY ROM:   Active ROM Left eval LEFT 7/18  Shoulder flexion 60 82  Shoulder extension 48   Shoulder abduction 46   Shoulder adduction    Shoulder internal rotation    Shoulder external rotation    (Blank rows = not tested)  PROM  Left 7/6  Shoulder flexion 90+ empty end feel  Shoulder extension   Shoulder abduction   Shoulder adduction   Shoulder internal rotation   Shoulder external rotation   (Blank rows = not tested)     PALPATION:  Significant tenderness to light palpation  TODAY'S TREATMENT 03/07/22  ROM: - PROM to tolerance all directions as tolerated; got to about 120 max flexion, ABD to about 110 degrees, ER to about 60-70 and IR to about 50 degrees today (approximately) - AAROM: supine flexion with cane 1x10 L UE with 5 second holds, supine ABD with cane 1x10 L UE with 5 second holds , seated IR/ER 1x10 with cane 5 second holds each way   - pillowcase  slides on the door 1x10 L UE AROM with self over pressure  - pillowcase slides on the door 1x10 L UE AROM with self over pressure     TODAY'S TREATMENT:  [facility] Adult PT Treatment:                                                DATE: 02/28/22 MANUAL: PROM Lt shoulder- empty end feel with pain at 100-110; STM Rt upper trap & levator; passive elbow & wrist motion; scar tissue mob at distal humerus and anterior elbow  Pulleys,: fwd flexion from standing  Standing scap retraction  Seated table slides: flexion, abduction/adduction from midline alternating UEs Seated biceps curls supinated, alt Rt/Lt  [facility] Adult PT Treatment:                                                DATE: 02/21/22 Therapeutic Exercise: Reviewed HEP Manual Therapy: Passive shoulder PROM flexion, scaption to tolerance approx 100 degrees each, IR/ER to tolerance with pt more limited with IR stating "tightness felt but not pain," elbow flexion/ext PROM, forearm pronation/supination PROM, wrist flexion/ext/radial deviation/ulnar deviation PROM with varying degrees of elbow flex/ext. All to pt tolerance.   EVAL Scap retraction Elbow extension Putty squeezes Wrist flexion/ext MANUAL: passive flexion ROM    PATIENT EDUCATION: Education details: Anatomy of condition, POC, HEP, exercise form/rationale  Person educated: Patient Education method: Explanation, Demonstration, Tactile cues, Verbal cues, and Handouts Education comprehension: verbalized understanding, returned demonstration, verbal cues required, tactile cues required, and needs further education   HOME EXERCISE PROGRAM: [id number]  ASSESSMENT:  CLINICAL IMPRESSION:  Patricia George arrives doing OK today, arm is bothering her a bit still as she  was in the car today. We continued working on PROM, AAROM, AROM as tolerated able to get to 90 degrees actively but still having a lot of pain when reaching overhead. Incisions are still really tender, educated on habituation  techniques using washcloth/pillow case/items of different textures to help reduce sensitivity. Per her report, seems to be getting close to L UE ROM before the fracture. Will continue to progress as able and tolerated.     OBJECTIVE IMPAIRMENTS decreased activity tolerance, decreased ROM, decreased strength, increased edema, impaired flexibility, impaired UE functional use, improper body mechanics, postural dysfunction, and pain.   ACTIVITY LIMITATIONS carrying, lifting, sleeping, bathing, dressing, reach over head, and hygiene/grooming  PARTICIPATION LIMITATIONS: meal prep, cleaning, laundry, driving, and community activity  PERSONAL FACTORS  DM  are also affecting patient's functional outcome.   REHAB POTENTIAL: Good  CLINICAL DECISION MAKING: Stable/uncomplicated  EVALUATION COMPLEXITY: Low   GOALS: Goals reviewed with patient? Yes  SHORT TERM GOALS: Target date: 03/10/22  Passive shoulder flexion to 90 without increase in pain Baseline: Goal status: INITIAL  2.  Independent in HEP for arm/hand to reduce edema Baseline:  Goal status: INITIAL   LONG TERM GOALS: Target date:04/28/22  Pt will be able to knit without limitation by pain Baseline:  Goal status: INITIAL  2.  Able to perform all self care dressing, feeding, grooming; pain <=2/10  Baseline:  Goal status: INITIAL  3.  Able to demo good posture through shoulder in reaching and lifting light objects overhead Baseline:  Goal status: INITIAL  4.  Demo 75% strength via dynamometry testing to Lt UE Baseline:  Goal status: INITIAL    PLAN: PT FREQUENCY: 1-2x/week  PT DURATION: 10 weeks  PLANNED INTERVENTIONS: Therapeutic exercises, Therapeutic activity, Neuromuscular re-education, Patient/Family education, Joint mobilization, Aquatic Therapy, Dry Needling, Electrical stimulation, Spinal mobilization, Cryotherapy, Moist heat, Taping, and Manual therapy  PLAN FOR NEXT SESSION: continue PROM, edema  management, periscap activation   Kiaya Haliburton U PT DPT PN2  03/07/2022, 1:43 PM

## 2022-03-08 ENCOUNTER — Ambulatory Visit: Payer: Medicare Other

## 2022-03-09 ENCOUNTER — Encounter (HOSPITAL_BASED_OUTPATIENT_CLINIC_OR_DEPARTMENT_OTHER): Payer: Self-pay | Admitting: Physical Therapy

## 2022-03-09 ENCOUNTER — Ambulatory Visit (HOSPITAL_BASED_OUTPATIENT_CLINIC_OR_DEPARTMENT_OTHER): Payer: Medicare Other | Admitting: Physical Therapy

## 2022-03-09 DIAGNOSIS — M25612 Stiffness of left shoulder, not elsewhere classified: Secondary | ICD-10-CM

## 2022-03-09 DIAGNOSIS — S42332A Displaced oblique fracture of shaft of humerus, left arm, initial encounter for closed fracture: Secondary | ICD-10-CM | POA: Diagnosis not present

## 2022-03-09 DIAGNOSIS — M6281 Muscle weakness (generalized): Secondary | ICD-10-CM | POA: Diagnosis not present

## 2022-03-09 DIAGNOSIS — M79622 Pain in left upper arm: Secondary | ICD-10-CM

## 2022-03-09 NOTE — Therapy (Signed)
OUTPATIENT PHYSICAL THERAPY SHOULDER Treatment   Patient Name: Patricia George MRN: [id number] DOB:May 02, 1956, 66 y.o., female Today's Date: 03/09/2022   PT End of Session - 03/09/22 1233     Visit Number 5    Number of Visits 21    Date for PT Re-Evaluation 04/28/22    Authorization Type [employer] MCR    Progress Note Due on Visit 10    PT Start Time 1146    PT Stop Time 1227    PT Time Calculation (min) 41 min    Activity Tolerance Patient tolerated treatment well    Behavior During Therapy WFL for tasks assessed/performed                Past Medical History:  Diagnosis Date   Ankle fracture    Arthritis    Diabetes mellitus without complication ([facility])    Hypertension    Past Surgical History:  Procedure Laterality Date   ANKLE FRACTURE SURGERY Right    HUMERUS IM NAIL Left 01/31/2022   Procedure: LEFT INTRAMEDULLARY (IM) NAIL HUMERAL;  Surgeon: Huel Cote, MD;  Location: [facility];  Service: Orthopedics;  Laterality: Left;   TONSILLECTOMY     Patient Active Problem List   Diagnosis Date Noted   Closed displaced oblique fracture of shaft of left humerus     PCP: Sigmund Hazel, MD  REFERRING PROVIDER: Huel Cote, MD  REFERRING DIAG: [phone] (ICD-10-CM) - Closed displaced oblique fracture of shaft of left humerus, initial encounter  THERAPY DIAG:  Pain in left upper arm  Muscle weakness (generalized)  Stiffness of left shoulder, not elsewhere classified  Rationale for Evaluation and Treatment Rehabilitation  ONSET DATE: DOS 01/31/22, injury 01/21/22  SUBJECTIVE:                                                                                                                                                                                      SUBJECTIVE STATEMENT:   Doing well, felt good after last session. Having trouble reaching across myself to put on deodorant and hygiene tasks. Ice helps. Able to use hand for driving and in general more easily. Able to  sleep on my back in the bed without pain last night.   PERTINENT HISTORY: Arthritis, DM  PAIN:  Are you having pain? Yes: NPRS scale: 3/10 Pain location: Left humerus, occasional pain into forearm and hand Pain description: achey/crampy feeling Aggravating factors: lifting too hard, reaching away Relieving factors: rest  PRECAUTIONS: As tolerated  WEIGHT BEARING RESTRICTIONS No  FALLS:  Has patient fallen in last 6 months? No  LIVING ENVIRONMENT: Lives independently  OCCUPATION: Not working  PLOF: Independent  PATIENT GOALS get  back to where I was, knitting  OBJECTIVE:   DIAGNOSTIC FINDINGS:  Well placed humeral rod, healing well  PATIENT SURVEYS:  UEFS 17/80  COGNITION:  Overall cognitive status: Within functional limits for tasks assessed     SENSATION: Feels like she has poor control of the arm  POSTURE: Rounded Left shoulder as expected  UPPER EXTREMITY ROM:   Active ROM Left eval LEFT 7/18  Shoulder flexion 60 82  Shoulder extension 48   Shoulder abduction 46   Shoulder adduction    Shoulder internal rotation    Shoulder external rotation    (Blank rows = not tested)  PROM  Left 7/6  Shoulder flexion 90+ empty end feel  Shoulder extension   Shoulder abduction   Shoulder adduction   Shoulder internal rotation   Shoulder external rotation   (Blank rows = not tested)     PALPATION:  Significant tenderness to light palpation  TODAY'S TREATMENT 03/09/22  UBE L1 3 min forward/3 min backward  AAROM: - flexion 1x12 5 second holds supine - supine ABD 1x12 5 second holds  - IR/ER AAROM seated 1x12 5 seconds holds  AROM:  - supine flexion 1x10 to tolerance limited by pain   PROM: - flexion x10 much more tender/painful coming back down - ABD x10 to tolerance, able to tolerate this better than flexion today   -scapular retractions 1x15 3 second holds mod facilitation to get quality rhomboid contraction instead of UT compensation     TODAY'S TREATMENT 03/07/22  ROM: - PROM to tolerance all directions as tolerated; got to about 120 max flexion, ABD to about 110 degrees, ER to about 60-70 and IR to about 50 degrees today (approximately) - AAROM: supine flexion with cane 1x10 L UE with 5 second holds, supine ABD with cane 1x10 L UE with 5 second holds , seated IR/ER 1x10 with cane 5 second holds each way   - pillowcase slides on the door 1x10 L UE AROM with self over pressure  - pillowcase slides on the door 1x10 L UE AROM with self over pressure     TODAY'S TREATMENT:  [facility] Adult PT Treatment:                                                DATE: 02/28/22 MANUAL: PROM Lt shoulder- empty end feel with pain at 100-110; STM Rt upper trap & levator; passive elbow & wrist motion; scar tissue mob at distal humerus and anterior elbow  Pulleys,: fwd flexion from standing  Standing scap retraction  Seated table slides: flexion, abduction/adduction from midline alternating UEs Seated biceps curls supinated, alt Rt/Lt  [facility] Adult PT Treatment:                                                DATE: 02/21/22 Therapeutic Exercise: Reviewed HEP Manual Therapy: Passive shoulder PROM flexion, scaption to tolerance approx 100 degrees each, IR/ER to tolerance with pt more limited with IR stating "tightness felt but not pain," elbow flexion/ext PROM, forearm pronation/supination PROM, wrist flexion/ext/radial deviation/ulnar deviation PROM with varying degrees of elbow flex/ext. All to pt tolerance.   EVAL Scap retraction Elbow extension Putty squeezes Wrist flexion/ext MANUAL: passive flexion ROM  PATIENT EDUCATION: Education details: Teacher, music of condition, POC, HEP, exercise form/rationale  Person educated: Patient Education method: Explanation, Demonstration, Tactile cues, Verbal cues, and Handouts Education comprehension: verbalized understanding, returned demonstration, verbal cues required, tactile cues required, and  needs further education   HOME EXERCISE PROGRAM: [id number]  ASSESSMENT:  CLINICAL IMPRESSION:  Gavin arrives doing OK today, felt better after last session and has been able to use her arm more functionally. Warmed up on the UBE, spent a little more time on AAROM and AROM this session. Still very limited in flexion and ABD ROM over 90 with L UE. Having some elbow pain with shoulder ROM, I wonder if a spasm in biceps or triceps is feeding down to this joint and causing discomfort. Will continue to progress as able and tolerated.    OBJECTIVE IMPAIRMENTS decreased activity tolerance, decreased ROM, decreased strength, increased edema, impaired flexibility, impaired UE functional use, improper body mechanics, postural dysfunction, and pain.   ACTIVITY LIMITATIONS carrying, lifting, sleeping, bathing, dressing, reach over head, and hygiene/grooming  PARTICIPATION LIMITATIONS: meal prep, cleaning, laundry, driving, and community activity  PERSONAL FACTORS  DM  are also affecting patient's functional outcome.   REHAB POTENTIAL: Good  CLINICAL DECISION MAKING: Stable/uncomplicated  EVALUATION COMPLEXITY: Low   GOALS: Goals reviewed with patient? Yes  SHORT TERM GOALS: Target date: 03/10/22  Passive shoulder flexion to 90 without increase in pain Baseline: Goal status: INITIAL  2.  Independent in HEP for arm/hand to reduce edema Baseline:  Goal status: INITIAL   LONG TERM GOALS: Target date:04/28/22  Pt will be able to knit without limitation by pain Baseline:  Goal status: INITIAL  2.  Able to perform all self care dressing, feeding, grooming; pain <=2/10  Baseline:  Goal status: INITIAL  3.  Able to demo good posture through shoulder in reaching and lifting light objects overhead Baseline:  Goal status: INITIAL  4.  Demo 75% strength via dynamometry testing to Lt UE Baseline:  Goal status: INITIAL    PLAN: PT FREQUENCY: 1-2x/week  PT DURATION: 10  weeks  PLANNED INTERVENTIONS: Therapeutic exercises, Therapeutic activity, Neuromuscular re-education, Patient/Family education, Joint mobilization, Aquatic Therapy, Dry Needling, Electrical stimulation, Spinal mobilization, Cryotherapy, Moist heat, Taping, and Manual therapy  PLAN FOR NEXT SESSION: continue PROM, edema management, periscap activation   Devory Mckinzie U PT DPT PN2  03/09/2022, 12:34 PM

## 2022-03-13 ENCOUNTER — Ambulatory Visit (INDEPENDENT_AMBULATORY_CARE_PROVIDER_SITE_OTHER): Payer: Medicare Other | Admitting: Orthopaedic Surgery

## 2022-03-13 ENCOUNTER — Ambulatory Visit (INDEPENDENT_AMBULATORY_CARE_PROVIDER_SITE_OTHER): Payer: Medicare Other

## 2022-03-13 DIAGNOSIS — S42332A Displaced oblique fracture of shaft of humerus, left arm, initial encounter for closed fracture: Secondary | ICD-10-CM | POA: Diagnosis not present

## 2022-03-13 DIAGNOSIS — S42302A Unspecified fracture of shaft of humerus, left arm, initial encounter for closed fracture: Secondary | ICD-10-CM | POA: Diagnosis not present

## 2022-03-13 NOTE — Therapy (Signed)
OUTPATIENT PHYSICAL THERAPY SHOULDER Treatment   Patient Name: Patricia George MRN: [id number] DOB:07/05/1956, 66 y.o., female Today's Date: 03/14/2022   PT End of Session - 03/14/22 1111     Visit Number 6    Number of Visits 21    Date for PT Re-Evaluation 04/28/22    Authorization Type [employer] MCR    Progress Note Due on Visit 10    PT Start Time 1114    PT Stop Time 1200    PT Time Calculation (min) 46 min    Activity Tolerance Patient tolerated treatment well    Behavior During Therapy WFL for tasks assessed/performed                 Past Medical History:  Diagnosis Date   Ankle fracture    Arthritis    Diabetes mellitus without complication ([place])    Hypertension    Past Surgical History:  Procedure Laterality Date   ANKLE FRACTURE SURGERY Right    HUMERUS IM NAIL Left 01/31/2022   Procedure: LEFT INTRAMEDULLARY (IM) NAIL HUMERAL;  Surgeon: Vanetta Mulders, MD;  Location: [facility];  Service: Orthopedics;  Laterality: Left;   TONSILLECTOMY     Patient Active Problem List   Diagnosis Date Noted   Closed displaced oblique fracture of shaft of left humerus     PCP: Kathyrn Lass, MD  REFERRING PROVIDER: Vanetta Mulders, MD  REFERRING DIAG: [phone] (ICD-10-CM) - Closed displaced oblique fracture of shaft of left humerus, initial encounter  THERAPY DIAG:  Pain in left upper arm  Muscle weakness (generalized)  Stiffness of left shoulder, not elsewhere classified  Rationale for Evaluation and Treatment Rehabilitation  ONSET DATE: DOS 01/31/22, injury 01/21/22  SUBJECTIVE:                                                                                                                                                                                      SUBJECTIVE STATEMENT:   I am doing good. Minimal pain today. Still hard to put on deodorant-feels swollen   PERTINENT HISTORY: Arthritis, DM  PAIN:  Are you having pain? Yes: NPRS scale: 0-1/10 Pain location:  Left humerus, occasional pain into forearm and hand Pain description: achey/crampy feeling Aggravating factors: lifting too hard, reaching away Relieving factors: rest  PRECAUTIONS: As tolerated  WEIGHT BEARING RESTRICTIONS No  FALLS:  Has patient fallen in last 6 months? No  LIVING ENVIRONMENT: Lives independently  OCCUPATION: Not working  PLOF: Independent  PATIENT GOALS get back to where I was, knitting  OBJECTIVE:   DIAGNOSTIC FINDINGS:  Well placed humeral rod, healing well  PATIENT SURVEYS:  UEFS 17/80  COGNITION:  Overall cognitive  status: Within functional limits for tasks assessed     SENSATION: Feels like she has poor control of the arm  POSTURE: Rounded Left shoulder as expected  UPPER EXTREMITY ROM:   Active ROM Left eval LEFT 7/18  Shoulder flexion 60 82  Shoulder extension 48   Shoulder abduction 46   Shoulder adduction    Shoulder internal rotation    Shoulder external rotation    (Blank rows = not tested)  PROM  Left 7/6  Shoulder flexion 90+ empty end feel  Shoulder extension   Shoulder abduction   Shoulder adduction   Shoulder internal rotation   Shoulder external rotation   (Blank rows = not tested)     PALPATION:  Significant tenderness to light palpation  TODAY'S TREATMENT  [facility] Adult PT Treatment:                                                DATE: 03/14/22 Therapeutic Exercise: UBE L2 3 min forward/3 min backward  AAROM: - flexion with dowel x20 3 second holds supine with modifications needing to be made to shorten lever arm to return to neutral due to L shoulder soreness - supine ABD with dowel x 20 5 second holds  - IR/ER AAROM supine with dowel x20 3 seconds holds -chest press with dowel x 20 -PNF pattern D1/D2 AAROM limited ROM due to discomfort at end ROM x 8 each pattern -Pulleys horiz abdct/addct x 10  -shoulder ext iso with elbow flexed/ext x 20 each with proper L periscapular muscle activation  -supine  AROM elbow flex/ext 2 x 15  Manual Therapy: PROM: - flexion x15 much more tender/painful coming back down - ABD x15 to tolerance, able to tolerate this better than flexion today  -IR/ER to tolerance x 15 each -reaching across body various angles     03/09/22  UBE L1 3 min forward/3 min backward  AAROM: - flexion 1x12 5 second holds supine - supine ABD 1x12 5 second holds  - IR/ER AAROM seated 1x12 5 seconds holds  AROM:  - supine flexion 1x10 to tolerance limited by pain   PROM: - flexion x10 much more tender/painful coming back down - ABD x10 to tolerance, able to tolerate this better than flexion today   -scapular retractions 1x15 3 second holds mod facilitation to get quality rhomboid contraction instead of UT compensation    TODAY'S TREATMENT 03/07/22  ROM: - PROM to tolerance all directions as tolerated; got to about 120 max flexion, ABD to about 110 degrees, ER to about 60-70 and IR to about 50 degrees today (approximately) - AAROM: supine flexion with cane 1x10 L UE with 5 second holds, supine ABD with cane 1x10 L UE with 5 second holds , seated IR/ER 1x10 with cane 5 second holds each way   - pillowcase slides on the door 1x10 L UE AROM with self over pressure  - pillowcase slides on the door 1x10 L UE AROM with self over pressure     TODAY'S TREATMENT:  [facility] Adult PT Treatment:                                                DATE: 02/28/22 MANUAL: PROM Lt shoulder- empty  end feel with pain at 100-110; STM Rt upper trap & levator; passive elbow & wrist motion; scar tissue mob at distal humerus and anterior elbow  Pulleys,: fwd flexion from standing  Standing scap retraction  Seated table slides: flexion, abduction/adduction from midline alternating UEs Seated biceps curls supinated, alt Rt/Lt  [facility] Adult PT Treatment:                                                DATE: 02/21/22 Therapeutic Exercise: Reviewed HEP Manual Therapy: Passive shoulder PROM flexion,  scaption to tolerance approx 100 degrees each, IR/ER to tolerance with pt more limited with IR stating "tightness felt but not pain," elbow flexion/ext PROM, forearm pronation/supination PROM, wrist flexion/ext/radial deviation/ulnar deviation PROM with varying degrees of elbow flex/ext. All to pt tolerance.   EVAL Scap retraction Elbow extension Putty squeezes Wrist flexion/ext MANUAL: passive flexion ROM    PATIENT EDUCATION: Education details: Anatomy of condition, POC, HEP, exercise form/rationale  Person educated: Patient Education method: Explanation, Demonstration, Tactile cues, Verbal cues, and Handouts Education comprehension: verbalized understanding, returned demonstration, verbal cues required, tactile cues required, and needs further education   HOME EXERCISE PROGRAM: [id number]  ASSESSMENT:  CLINICAL IMPRESSION:  Pt continues to demonstrate improvement towards PT services and functional gains. She states she is using her L UE at home for functional activities without deficit. She continues to have difficulty with AAROM/PROM relaxing her L arm to neutral after performing desired motion which increases shoulder discomfort. However, she is able to perform with modifications and concentration with decreased difficulty. Pt would benefit from further PT for L UE ROM therex, scapular therex, and UE strengthening as tolerated.    OBJECTIVE IMPAIRMENTS decreased activity tolerance, decreased ROM, decreased strength, increased edema, impaired flexibility, impaired UE functional use, improper body mechanics, postural dysfunction, and pain.   ACTIVITY LIMITATIONS carrying, lifting, sleeping, bathing, dressing, reach over head, and hygiene/grooming  PARTICIPATION LIMITATIONS: meal prep, cleaning, laundry, driving, and community activity  PERSONAL FACTORS  DM  are also affecting patient's functional outcome.   REHAB POTENTIAL: Good  CLINICAL DECISION MAKING:  Stable/uncomplicated  EVALUATION COMPLEXITY: Low   GOALS: Goals reviewed with patient? Yes  SHORT TERM GOALS: Target date: 03/10/22  Passive shoulder flexion to 90 without increase in pain Baseline: Goal status: MET  2.  Independent in HEP for arm/hand to reduce edema Baseline:  Goal status: MET   LONG TERM GOALS: Target date:04/28/22  Pt will be able to knit without limitation by pain Baseline:  Goal status: MET  2.  Able to perform all self care dressing, feeding, grooming; pain <=2/10  Baseline:  Goal status: ONGOING  3.  Able to demo good posture through shoulder in reaching and lifting light objects overhead Baseline:  Goal status: ONGOING  4.  Demo 75% strength via dynamometry testing to Lt UE Baseline:  Goal status: ONGOING    PLAN: PT FREQUENCY: 1-2x/week  PT DURATION: 10 weeks  PLANNED INTERVENTIONS: Therapeutic exercises, Therapeutic activity, Neuromuscular re-education, Patient/Family education, Joint mobilization, Aquatic Therapy, Dry Needling, Electrical stimulation, Spinal mobilization, Cryotherapy, Moist heat, Taping, and Manual therapy  PLAN FOR NEXT SESSION: continue PROM,  periscap activation

## 2022-03-13 NOTE — Progress Notes (Signed)
Post Operative Evaluation    Procedure/Date of Surgery: Left humeral nailing 01/31/22  Interval History:   Presents today 6 weeks status post left humeral intramedullary nailing.  She is continue to work with physical therapy twice weekly.  She is now progressing to the point where she is able to lift the arm overhead.  She states that the pain continues to improve weekly.  Denies any numbness in the upper extremity.  PMH/PSH/Family History/Social History/Meds/Allergies:    Past Medical History:  Diagnosis Date   Ankle fracture    Arthritis    Diabetes mellitus without complication ([facility])    Hypertension    Past Surgical History:  Procedure Laterality Date   ANKLE FRACTURE SURGERY Right    HUMERUS IM NAIL Left 01/31/2022   Procedure: LEFT INTRAMEDULLARY (IM) NAIL HUMERAL;  Surgeon: Huel Cote, MD;  Location: [facility];  Service: Orthopedics;  Laterality: Left;   TONSILLECTOMY     Social History   Socioeconomic History   Marital status: Divorced    Spouse name: Not on file   Number of children: Not on file   Years of education: Not on file   Highest education level: Not on file  Occupational History   Not on file  Tobacco Use   Smoking status: Never   Smokeless tobacco: Never  Substance and Sexual Activity   Alcohol use: Yes    Comment: occ   Drug use: Never   Sexual activity: Not on file  Other Topics Concern   Not on file  Social History Narrative   Not on file   Social Determinants of Health   Financial Resource Strain: Not on file  Food Insecurity: Not on file  Transportation Needs: Not on file  Physical Activity: Not on file  Stress: Not on file  Social Connections: Not on file   No family history on file. Allergies  Allergen Reactions   Atorvastatin     Other reaction(s): myalgias, abd pain   Lisinopril     Other reaction(s): cough   Losartan Potassium-Hctz     Other reaction(s): cough   Simvastatin Other (See  Comments)    Myalgias   Current Outpatient Medications  Medication Sig Dispense Refill   Ascorbic Acid (VITAMIN C) 1000 MG tablet Take 1,000 mg by mouth daily.     aspirin EC 325 MG tablet Take 1 tablet (325 mg total) by mouth daily. (Patient taking differently: Take 81 mg by mouth 2 (two) times daily.) 30 tablet 0   Calcium Carb-Cholecalciferol (CALCIUM 600+D) 600-20 MG-MCG TABS Take 1 tablet by mouth daily.     Cholecalciferol (VITAMIN D3) 50 MCG (2000 UT) capsule Take 2,000 Units by mouth daily.     COVID-19 mRNA Vac-TriS, Pfizer, (PFIZER-BIONT COVID-19 VAC-TRIS) SUSP injection Inject into the muscle. 0.3 mL 0   ferrous sulfate 324 MG TBEC Take 324 mg by mouth daily with breakfast.     metFORMIN (GLUCOPHAGE-XR) 750 MG 24 hr tablet Take 1,500 mg by mouth daily.     metoprolol succinate (TOPROL-XL) 50 MG 24 hr tablet Take 50 mg by mouth daily.     Multiple Vitamins-Minerals (MULTIVITAMIN WITH MINERALS) tablet Take 1 tablet by mouth daily. Woman's     Omega-3 Fatty Acids (FISH OIL PO) Take 900 mg by mouth daily. DHA/EPA     oxyCODONE (OXY  IR/ROXICODONE) 5 MG immediate release tablet Take 1 tablet (5 mg total) by mouth every 4 (four) hours as needed (severe pain). 20 tablet 0   pravastatin (PRAVACHOL) 40 MG tablet Take 40 mg by mouth daily.     sertraline (ZOLOFT) 50 MG tablet Take 50 mg by mouth daily.     traMADol (ULTRAM) 50 MG tablet Take 1 tablet (50 mg total) by mouth every 8 (eight) hours as needed for severe pain. 11 tablet 0   No current facility-administered medications for this visit.   No results found.  Review of Systems:   A ROS was performed including pertinent positives and negatives as documented in the HPI.   Musculoskeletal Exam:    There were no vitals taken for this visit.  Left shoulder incision is well-healed.  Distal incisions are also healed.  She is able to flex and extend at the left elbow.  Left shoulder forward active elevation is to 100 degrees.  External  rotation at the side is to 40 degrees.  Internal rotation is to back pocket.  Is able to fire EPL as well as wrist extensors.  2+ radial pulse.  Imaging:    X-ray humerus 2 views 3 views: Status post left humeral nailing without evidence of complication  I personally reviewed and interpreted the radiographs.   Assessment:   66 year old female who is 6 weeks status post left humeral nailing overall continuing to progress.  At this time she will begin active overhead range of motion.  I still would like her to hold off on any type of heavy lifting until she has more significant callus formation.  She will continue to advance per protocol.  All restrictions and limitations were discussed.  I will plan to see her back in 6 weeks for reassessment  Plan :    -Return to clinic in 6 weeks for reassessment      I personally saw and evaluated the patient, and participated in the management and treatment plan.  Huel Cote, MD Attending Physician, Orthopedic Surgery  This document was dictated using Dragon voice recognition software. A reasonable attempt at proof reading has been made to minimize errors.

## 2022-03-14 ENCOUNTER — Ambulatory Visit (HOSPITAL_BASED_OUTPATIENT_CLINIC_OR_DEPARTMENT_OTHER): Payer: Medicare Other | Attending: Orthopaedic Surgery | Admitting: Rehabilitative and Restorative Service Providers"

## 2022-03-14 ENCOUNTER — Encounter (HOSPITAL_BASED_OUTPATIENT_CLINIC_OR_DEPARTMENT_OTHER): Payer: Self-pay | Admitting: Rehabilitative and Restorative Service Providers"

## 2022-03-14 DIAGNOSIS — M25612 Stiffness of left shoulder, not elsewhere classified: Secondary | ICD-10-CM | POA: Diagnosis not present

## 2022-03-14 DIAGNOSIS — M6281 Muscle weakness (generalized): Secondary | ICD-10-CM | POA: Insufficient documentation

## 2022-03-14 DIAGNOSIS — M79622 Pain in left upper arm: Secondary | ICD-10-CM | POA: Insufficient documentation

## 2022-03-16 ENCOUNTER — Encounter (HOSPITAL_BASED_OUTPATIENT_CLINIC_OR_DEPARTMENT_OTHER): Payer: Medicare Other | Admitting: Physical Therapy

## 2022-03-19 NOTE — Therapy (Signed)
OUTPATIENT PHYSICAL THERAPY SHOULDER Treatment   Patient Name: Patricia George MRN: [id number] DOB:Mar 05, 1956, 66 y.o., female Today's Date: 03/20/2022   PT End of Session - 03/20/22 1149     Visit Number 7    Number of Visits 21    Date for PT Re-Evaluation 04/28/22    Authorization Type [employer] MCR    Progress Note Due on Visit 10    PT Start Time 1148    PT Stop Time 1228    PT Time Calculation (min) 40 min    Activity Tolerance Patient tolerated treatment well    Behavior During Therapy WFL for tasks assessed/performed                  Past Medical History:  Diagnosis Date   Ankle fracture    Arthritis    Diabetes mellitus without complication ([place])    Hypertension    Past Surgical History:  Procedure Laterality Date   ANKLE FRACTURE SURGERY Right    HUMERUS IM NAIL Left 01/31/2022   Procedure: LEFT INTRAMEDULLARY (IM) NAIL HUMERAL;  Surgeon: Vanetta Mulders, MD;  Location: [facility] [facility];  Service: Orthopedics;  Laterality: Left;   TONSILLECTOMY     Patient Active Problem List   Diagnosis Date Noted   Closed displaced oblique fracture of shaft of left humerus     PCP: Kathyrn Lass, MD  REFERRING PROVIDER: Vanetta Mulders, MD  REFERRING DIAG: [phone] (ICD-10-CM) - Closed displaced oblique fracture of shaft of left humerus, initial encounter  THERAPY DIAG:  Pain in left upper arm  Muscle weakness (generalized)  Stiffness of left shoulder, not elsewhere classified  Rationale for Evaluation and Treatment Rehabilitation  ONSET DATE: DOS 01/31/22, injury 01/21/22  SUBJECTIVE:                                                                                                                                                                                      SUBJECTIVE STATEMENT:   Stiff and sore. Had a hard time sleeping today  PERTINENT HISTORY: Arthritis, DM  PAIN:  Are you having pain? Yes: NPRS scale: 0-1/10 Pain location: stiff and sore Pain description:  achey/crampy feeling Aggravating factors: lifting too hard, reaching away Relieving factors: rest  PRECAUTIONS: As tolerated  WEIGHT BEARING RESTRICTIONS No  FALLS:  Has patient fallen in last 6 months? No  LIVING ENVIRONMENT: Lives independently  OCCUPATION: Not working  PLOF: Independent  PATIENT GOALS get back to where I was, knitting  OBJECTIVE:   DIAGNOSTIC FINDINGS:  Well placed humeral rod, healing well  PATIENT SURVEYS:  UEFS 17/80  COGNITION:  Overall cognitive status: Within functional limits for tasks assessed  SENSATION: Feels like she has poor control of the arm  POSTURE: Rounded Left shoulder as expected  UPPER EXTREMITY ROM:   Active ROM Left eval LEFT 7/18 Left 8/7  Shoulder flexion 60 82 90  Shoulder extension 48  44  Shoulder abduction 46  62  Shoulder adduction     Shoulder internal rotation     Shoulder external rotation     (Blank rows = not tested)  PROM  Left 7/6  Shoulder flexion 90+ empty end feel  Shoulder extension   Shoulder abduction   Shoulder adduction   Shoulder internal rotation   Shoulder external rotation   (Blank rows = not tested)     PALPATION:  Significant tenderness to light palpation  TODAY'S TREATMENT  Treatment                            8/7: Pulleys flexion & scaption Physioball roll on table- flexion & scaption, circles Wall planks with weight shift MANUAL: STM to upper trap, levator & pectoralis; passive stretching of pecs     PATIENT EDUCATION: Education details: Anatomy of condition, POC, HEP, exercise form/rationale  Person educated: Patient Education method: Explanation, Demonstration, Tactile cues, Verbal cues, and Handouts Education comprehension: verbalized understanding, returned demonstration, verbal cues required, tactile cues required, and needs further education   HOME EXERCISE PROGRAM: [id number]  ASSESSMENT:  CLINICAL IMPRESSION: Continued improvement and asked her  to continue avoiding painful activities but use arm functionally.    OBJECTIVE IMPAIRMENTS decreased activity tolerance, decreased ROM, decreased strength, increased edema, impaired flexibility, impaired UE functional use, improper body mechanics, postural dysfunction, and pain.   ACTIVITY LIMITATIONS carrying, lifting, sleeping, bathing, dressing, reach over head, and hygiene/grooming  PARTICIPATION LIMITATIONS: meal prep, cleaning, laundry, driving, and community activity  PERSONAL FACTORS  DM  are also affecting patient's functional outcome.   REHAB POTENTIAL: Good  CLINICAL DECISION MAKING: Stable/uncomplicated  EVALUATION COMPLEXITY: Low   GOALS: Goals reviewed with patient? Yes  SHORT TERM GOALS: Target date: 03/10/22  Passive shoulder flexion to 90 without increase in pain Baseline: Goal status: MET  2.  Independent in HEP for arm/hand to reduce edema Baseline:  Goal status: MET   LONG TERM GOALS: Target date:04/28/22  Pt will be able to knit without limitation by pain Baseline:  Goal status: MET  2.  Able to perform all self care dressing, feeding, grooming; pain <=2/10  Baseline:  Goal status: ONGOING  3.  Able to demo good posture through shoulder in reaching and lifting light objects overhead Baseline:  Goal status: ONGOING  4.  Demo 75% strength via dynamometry testing to Lt UE Baseline:  Goal status: ONGOING    PLAN: PT FREQUENCY: 1-2x/week  PT DURATION: 10 weeks  PLANNED INTERVENTIONS: Therapeutic exercises, Therapeutic activity, Neuromuscular re-education, Patient/Family education, Joint mobilization, Aquatic Therapy, Dry Needling, Electrical stimulation, Spinal mobilization, Cryotherapy, Moist heat, Taping, and Manual therapy  PLAN FOR NEXT SESSION: continue a/AArom  Tayari Yankee C. Lashawndra Lampkins PT, DPT 03/20/22 12:35 PM

## 2022-03-20 ENCOUNTER — Ambulatory Visit (HOSPITAL_BASED_OUTPATIENT_CLINIC_OR_DEPARTMENT_OTHER): Payer: Medicare Other | Admitting: Physical Therapy

## 2022-03-20 ENCOUNTER — Encounter (HOSPITAL_BASED_OUTPATIENT_CLINIC_OR_DEPARTMENT_OTHER): Payer: Self-pay | Admitting: Physical Therapy

## 2022-03-20 DIAGNOSIS — M25612 Stiffness of left shoulder, not elsewhere classified: Secondary | ICD-10-CM | POA: Diagnosis not present

## 2022-03-20 DIAGNOSIS — M6281 Muscle weakness (generalized): Secondary | ICD-10-CM

## 2022-03-20 DIAGNOSIS — M79622 Pain in left upper arm: Secondary | ICD-10-CM | POA: Diagnosis not present

## 2022-03-22 ENCOUNTER — Encounter (HOSPITAL_BASED_OUTPATIENT_CLINIC_OR_DEPARTMENT_OTHER): Payer: Medicare Other | Admitting: Physical Therapy

## 2022-03-29 ENCOUNTER — Encounter (HOSPITAL_BASED_OUTPATIENT_CLINIC_OR_DEPARTMENT_OTHER): Payer: Medicare Other | Admitting: Physical Therapy

## 2022-03-31 ENCOUNTER — Encounter (HOSPITAL_BASED_OUTPATIENT_CLINIC_OR_DEPARTMENT_OTHER): Payer: Medicare Other | Admitting: Physical Therapy

## 2022-04-04 ENCOUNTER — Ambulatory Visit (HOSPITAL_BASED_OUTPATIENT_CLINIC_OR_DEPARTMENT_OTHER): Payer: Medicare Other | Admitting: Physical Therapy

## 2022-04-06 ENCOUNTER — Encounter (HOSPITAL_BASED_OUTPATIENT_CLINIC_OR_DEPARTMENT_OTHER): Payer: Medicare Other | Admitting: Physical Therapy

## 2022-04-10 ENCOUNTER — Ambulatory Visit: Payer: Medicare Other

## 2022-04-11 ENCOUNTER — Ambulatory Visit (HOSPITAL_BASED_OUTPATIENT_CLINIC_OR_DEPARTMENT_OTHER): Payer: Medicare Other | Admitting: Physical Therapy

## 2022-04-13 ENCOUNTER — Encounter (HOSPITAL_BASED_OUTPATIENT_CLINIC_OR_DEPARTMENT_OTHER): Payer: Medicare Other | Admitting: Physical Therapy

## 2022-04-20 DIAGNOSIS — I10 Essential (primary) hypertension: Secondary | ICD-10-CM | POA: Diagnosis not present

## 2022-04-20 DIAGNOSIS — W19XXXA Unspecified fall, initial encounter: Secondary | ICD-10-CM | POA: Diagnosis not present

## 2022-04-20 DIAGNOSIS — R739 Hyperglycemia, unspecified: Secondary | ICD-10-CM | POA: Diagnosis not present

## 2022-04-21 DIAGNOSIS — F32A Depression, unspecified: Secondary | ICD-10-CM | POA: Diagnosis not present

## 2022-04-21 DIAGNOSIS — W19XXXA Unspecified fall, initial encounter: Secondary | ICD-10-CM | POA: Diagnosis not present

## 2022-04-21 DIAGNOSIS — R569 Unspecified convulsions: Secondary | ICD-10-CM | POA: Diagnosis not present

## 2022-04-21 DIAGNOSIS — Z7401 Bed confinement status: Secondary | ICD-10-CM | POA: Diagnosis not present

## 2022-04-24 ENCOUNTER — Ambulatory Visit (INDEPENDENT_AMBULATORY_CARE_PROVIDER_SITE_OTHER): Payer: Medicare Other

## 2022-04-24 ENCOUNTER — Ambulatory Visit (INDEPENDENT_AMBULATORY_CARE_PROVIDER_SITE_OTHER): Payer: Medicare Other | Admitting: Orthopaedic Surgery

## 2022-04-24 DIAGNOSIS — S42332A Displaced oblique fracture of shaft of humerus, left arm, initial encounter for closed fracture: Secondary | ICD-10-CM

## 2022-04-24 DIAGNOSIS — S42302A Unspecified fracture of shaft of humerus, left arm, initial encounter for closed fracture: Secondary | ICD-10-CM | POA: Diagnosis not present

## 2022-04-24 NOTE — Progress Notes (Signed)
Post Operative Evaluation    Procedure/Date of Surgery: Left humeral nailing 01/31/22  Interval History:   3 months status post left humeral nail.  Presents today overall continues to make progress.  She is now actively able to elevate the arm overhead.  She states that she is now able to knit and not essentially remember that she has a fracture.  She is pleased with her progress.  She did miss her last several physical therapy sessions.  PMH/PSH/Family History/Social History/Meds/Allergies:    Past Medical History:  Diagnosis Date   Ankle fracture    Arthritis    Diabetes mellitus without complication (HCC)    Hypertension    Past Surgical History:  Procedure Laterality Date   ANKLE FRACTURE SURGERY Right    HUMERUS IM NAIL Left 01/31/2022   Procedure: LEFT INTRAMEDULLARY (IM) NAIL HUMERAL;  Surgeon: Huel Cote, MD;  Location: [facility] OR;  Service: Orthopedics;  Laterality: Left;   TONSILLECTOMY     Social History   Socioeconomic History   Marital status: Divorced    Spouse name: Not on file   Number of children: Not on file   Years of education: Not on file   Highest education level: Not on file  Occupational History   Not on file  Tobacco Use   Smoking status: Never   Smokeless tobacco: Never  Substance and Sexual Activity   Alcohol use: Yes    Comment: occ   Drug use: Never   Sexual activity: Not on file  Other Topics Concern   Not on file  Social History Narrative   Not on file   Social Determinants of Health   Financial Resource Strain: Not on file  Food Insecurity: Not on file  Transportation Needs: Not on file  Physical Activity: Not on file  Stress: Not on file  Social Connections: Not on file   No family history on file. Allergies  Allergen Reactions   Atorvastatin     Other reaction(s): myalgias, abd pain   Lisinopril     Other reaction(s): cough   Losartan Potassium-Hctz     Other reaction(s): cough    Simvastatin Other (See Comments)    Myalgias   Current Outpatient Medications  Medication Sig Dispense Refill   Ascorbic Acid (VITAMIN C) 1000 MG tablet Take 1,000 mg by mouth daily.     aspirin EC 325 MG tablet Take 1 tablet (325 mg total) by mouth daily. (Patient taking differently: Take 81 mg by mouth 2 (two) times daily.) 30 tablet 0   Calcium Carb-Cholecalciferol (CALCIUM 600+D) 600-20 MG-MCG TABS Take 1 tablet by mouth daily.     Cholecalciferol (VITAMIN D3) 50 MCG (2000 UT) capsule Take 2,000 Units by mouth daily.     COVID-19 mRNA Vac-TriS, Pfizer, (PFIZER-BIONT COVID-19 VAC-TRIS) SUSP injection Inject into the muscle. 0.3 mL 0   ferrous sulfate 324 MG TBEC Take 324 mg by mouth daily with breakfast.     metFORMIN (GLUCOPHAGE-XR) 750 MG 24 hr tablet Take 1,500 mg by mouth daily.     metoprolol succinate (TOPROL-XL) 50 MG 24 hr tablet Take 50 mg by mouth daily.     Multiple Vitamins-Minerals (MULTIVITAMIN WITH MINERALS) tablet Take 1 tablet by mouth daily. Woman's     Omega-3 Fatty Acids (FISH OIL PO) Take 900 mg by mouth daily. DHA/EPA  oxyCODONE (OXY IR/ROXICODONE) 5 MG immediate release tablet Take 1 tablet (5 mg total) by mouth every 4 (four) hours as needed (severe pain). 20 tablet 0   pravastatin (PRAVACHOL) 40 MG tablet Take 40 mg by mouth daily.     sertraline (ZOLOFT) 50 MG tablet Take 50 mg by mouth daily.     traMADol (ULTRAM) 50 MG tablet Take 1 tablet (50 mg total) by mouth every 8 (eight) hours as needed for severe pain. 11 tablet 0   No current facility-administered medications for this visit.   No results found.  Review of Systems:   A ROS was performed including pertinent positives and negatives as documented in the HPI.   Musculoskeletal Exam:    There were no vitals taken for this visit.  Left shoulder incision is well-healed.  Distal incisions are also healed.  She is able to flex and extend at the left elbow.  Left shoulder forward active elevation is to  120 degrees.  External rotation at the side is to 60 degrees.  Internal rotation is to back pocket.  Is able to fire EPL as well as wrist extensors.  2+ radial pulse.  Imaging:    X-ray humerus 2 views 3 views: Status post left humeral nailing without evidence of complication with increasing callus formation  I personally reviewed and interpreted the radiographs.   Assessment:   66 year old female who is 67-month-month status post left humeral nail continuing to improve.  She will continue to work through physical therapy.  I will plan to see her back in 3 months.  I did describe that her x-rays today show increasing callus remission with overall improvement  Plan :    -Return to clinic in 12 weeks for reassessment      I personally saw and evaluated the patient, and participated in the management and treatment plan.  Huel Cote, MD Attending Physician, Orthopedic Surgery  This document was dictated using Dragon voice recognition software. A reasonable attempt at proof reading has been made to minimize errors.

## 2022-07-10 DIAGNOSIS — E1122 Type 2 diabetes mellitus with diabetic chronic kidney disease: Secondary | ICD-10-CM | POA: Diagnosis not present

## 2022-07-10 DIAGNOSIS — Z0000 Encounter for general adult medical examination without abnormal findings: Secondary | ICD-10-CM | POA: Diagnosis not present

## 2022-07-10 DIAGNOSIS — E7800 Pure hypercholesterolemia, unspecified: Secondary | ICD-10-CM | POA: Diagnosis not present

## 2022-07-24 ENCOUNTER — Ambulatory Visit (INDEPENDENT_AMBULATORY_CARE_PROVIDER_SITE_OTHER): Payer: Medicare Other

## 2022-07-24 ENCOUNTER — Ambulatory Visit (INDEPENDENT_AMBULATORY_CARE_PROVIDER_SITE_OTHER): Payer: Medicare Other | Admitting: Orthopaedic Surgery

## 2022-07-24 DIAGNOSIS — S42332A Displaced oblique fracture of shaft of humerus, left arm, initial encounter for closed fracture: Secondary | ICD-10-CM

## 2022-07-24 DIAGNOSIS — M79622 Pain in left upper arm: Secondary | ICD-10-CM | POA: Diagnosis not present

## 2022-07-24 NOTE — Progress Notes (Signed)
Post Operative Evaluation    Procedure/Date of Surgery: Left humeral nailing 01/31/22  Interval History:   Patricia George presents today 6 months status post left humeral nailing overall doing very well.  At this time her motion and strength is improved to the point where she is able to do all of the activities that she wants.  Overall she is continuing to improve slowly on a weekly basis.  She is now reaching overhead although there is somewhat of a deficit compared to the contralateral side this is very minimal.  PMH/PSH/Family History/Social History/Meds/Allergies:    Past Medical History:  Diagnosis Date   Ankle fracture    Arthritis    Diabetes mellitus without complication (HCC)    Hypertension    Past Surgical History:  Procedure Laterality Date   ANKLE FRACTURE SURGERY Right    HUMERUS IM NAIL Left 01/31/2022   Procedure: LEFT INTRAMEDULLARY (IM) NAIL HUMERAL;  Surgeon: Huel Cote, MD;  Location: [facility] OR;  Service: Orthopedics;  Laterality: Left;   TONSILLECTOMY     Social History   Socioeconomic History   Marital status: Divorced    Spouse name: Not on file   Number of children: Not on file   Years of education: Not on file   Highest education level: Not on file  Occupational History   Not on file  Tobacco Use   Smoking status: Never   Smokeless tobacco: Never  Substance and Sexual Activity   Alcohol use: Yes    Comment: occ   Drug use: Never   Sexual activity: Not on file  Other Topics Concern   Not on file  Social History Narrative   Not on file   Social Determinants of Health   Financial Resource Strain: Not on file  Food Insecurity: Not on file  Transportation Needs: Not on file  Physical Activity: Not on file  Stress: Not on file  Social Connections: Not on file   No family history on file. Allergies  Allergen Reactions   Atorvastatin     Other reaction(s): myalgias, abd pain   Lisinopril     Other reaction(s):  cough   Losartan Potassium-Hctz     Other reaction(s): cough   Simvastatin Other (See Comments)    Myalgias   Current Outpatient Medications  Medication Sig Dispense Refill   Ascorbic Acid (VITAMIN C) 1000 MG tablet Take 1,000 mg by mouth daily.     aspirin EC 325 MG tablet Take 1 tablet (325 mg total) by mouth daily. (Patient taking differently: Take 81 mg by mouth 2 (two) times daily.) 30 tablet 0   Calcium Carb-Cholecalciferol (CALCIUM 600+D) 600-20 MG-MCG TABS Take 1 tablet by mouth daily.     Cholecalciferol (VITAMIN D3) 50 MCG (2000 UT) capsule Take 2,000 Units by mouth daily.     COVID-19 mRNA Vac-TriS, Pfizer, (PFIZER-BIONT COVID-19 VAC-TRIS) SUSP injection Inject into the muscle. 0.3 mL 0   ferrous sulfate 324 MG TBEC Take 324 mg by mouth daily with breakfast.     metFORMIN (GLUCOPHAGE-XR) 750 MG 24 hr tablet Take 1,500 mg by mouth daily.     metoprolol succinate (TOPROL-XL) 50 MG 24 hr tablet Take 50 mg by mouth daily.     Multiple Vitamins-Minerals (MULTIVITAMIN WITH MINERALS) tablet Take 1 tablet by mouth daily. Woman's     Omega-3  Fatty Acids (FISH OIL PO) Take 900 mg by mouth daily. DHA/EPA     oxyCODONE (OXY IR/ROXICODONE) 5 MG immediate release tablet Take 1 tablet (5 mg total) by mouth every 4 (four) hours as needed (severe pain). 20 tablet 0   pravastatin (PRAVACHOL) 40 MG tablet Take 40 mg by mouth daily.     sertraline (ZOLOFT) 50 MG tablet Take 50 mg by mouth daily.     traMADol (ULTRAM) 50 MG tablet Take 1 tablet (50 mg total) by mouth every 8 (eight) hours as needed for severe pain. 11 tablet 0   No current facility-administered medications for this visit.   No results found.  Review of Systems:   A ROS was performed including pertinent positives and negatives as documented in the HPI.   Musculoskeletal Exam:    There were no vitals taken for this visit.  Left shoulder incision is well-healed.  Distal incisions are also healed.  She is able to flex and extend  at the left elbow.  Left shoulder forward active elevation is to 120 degrees.  Strength improved and nearly equal to contralateral side.  External rotation at the side is to 60 degrees.  Internal rotation is to L1 today.  Is able to fire EPL as well as wrist extensors.  2+ radial pulse.  Imaging:    X-ray humerus 2 views 3 views: Status post left humeral nailing without evidence of complication with increasing callus formation with essentially healed fracture  I personally reviewed and interpreted the radiographs.   Assessment:   66 year old female who is 6 months status post left shoulder humeral nailing overall doing extremely well.  X-rays today show a completely healed fracture.  At this time I will plan to see her back on an as-needed basis.  Plan :    -Return to clinic as needed      I personally saw and evaluated the patient, and participated in the management and treatment plan.  Vanetta Mulders, MD Attending Physician, Orthopedic Surgery  This document was dictated using Dragon voice recognition software. A reasonable attempt at proof reading has been made to minimize errors.

## 2022-07-31 DIAGNOSIS — I10 Essential (primary) hypertension: Secondary | ICD-10-CM | POA: Diagnosis not present

## 2022-07-31 DIAGNOSIS — E1169 Type 2 diabetes mellitus with other specified complication: Secondary | ICD-10-CM | POA: Diagnosis not present

## 2022-07-31 DIAGNOSIS — E7800 Pure hypercholesterolemia, unspecified: Secondary | ICD-10-CM | POA: Diagnosis not present

## 2022-08-10 DIAGNOSIS — E119 Type 2 diabetes mellitus without complications: Secondary | ICD-10-CM | POA: Diagnosis not present

## 2022-08-10 DIAGNOSIS — H524 Presbyopia: Secondary | ICD-10-CM | POA: Diagnosis not present

## 2022-08-10 DIAGNOSIS — H5213 Myopia, bilateral: Secondary | ICD-10-CM | POA: Diagnosis not present

## 2022-08-15 ENCOUNTER — Ambulatory Visit
Admission: RE | Admit: 2022-08-15 | Discharge: 2022-08-15 | Disposition: A | Discharge status: Home / Self Care | Payer: Medicare Other | Source: Ambulatory Visit | Attending: Family Medicine | Admitting: Family Medicine

## 2022-08-15 DIAGNOSIS — E2839 Other primary ovarian failure: Secondary | ICD-10-CM

## 2022-08-15 DIAGNOSIS — M810 Age-related osteoporosis without current pathological fracture: Secondary | ICD-10-CM | POA: Diagnosis not present

## 2022-08-15 DIAGNOSIS — Z780 Asymptomatic menopausal state: Secondary | ICD-10-CM | POA: Diagnosis not present

## 2022-08-15 DIAGNOSIS — M8588 Other specified disorders of bone density and structure, other site: Secondary | ICD-10-CM | POA: Diagnosis not present

## 2023-02-06 DIAGNOSIS — I700 Atherosclerosis of aorta: Secondary | ICD-10-CM | POA: Diagnosis not present

## 2023-02-06 DIAGNOSIS — E119 Type 2 diabetes mellitus without complications: Secondary | ICD-10-CM | POA: Diagnosis not present

## 2023-02-06 DIAGNOSIS — E1169 Type 2 diabetes mellitus with other specified complication: Secondary | ICD-10-CM | POA: Diagnosis not present

## 2023-02-06 DIAGNOSIS — M810 Age-related osteoporosis without current pathological fracture: Secondary | ICD-10-CM | POA: Diagnosis not present

## 2023-05-15 DIAGNOSIS — E7800 Pure hypercholesterolemia, unspecified: Secondary | ICD-10-CM | POA: Diagnosis not present

## 2023-05-15 DIAGNOSIS — Z23 Encounter for immunization: Secondary | ICD-10-CM | POA: Diagnosis not present

## 2023-05-15 DIAGNOSIS — E119 Type 2 diabetes mellitus without complications: Secondary | ICD-10-CM | POA: Diagnosis not present

## 2023-07-16 DIAGNOSIS — Z1389 Encounter for screening for other disorder: Secondary | ICD-10-CM | POA: Diagnosis not present

## 2023-07-16 DIAGNOSIS — Z0000 Encounter for general adult medical examination without abnormal findings: Secondary | ICD-10-CM | POA: Diagnosis not present

## 2023-07-23 IMAGING — CT CT HUMERUS*L* W/O CM
2 of 5 series · 6 of 20 positions shown, 7 images · non-contrast
Comparison: Plain film radiograph obtained earlier today

CLINICAL DATA: Left upper extremity fracture.

EXAM:
CT OF THE UPPER LEFT EXTREMITY WITHOUT CONTRAST
TECHNIQUE: Multidetector CT imaging of the upper left extremity was performed
according to the standard protocol.
RADIATION DOSE REDUCTION: This exam was performed according to the
departmental dose-optimization program which includes automated
exposure control, adjustment of the mA and/or kV according to
patient size and/or use of iterative reconstruction technique.

[Series 8: cor st · coronal · 0.41mm/px · 3 of 181 slices shown]
[im 60/181  bone]
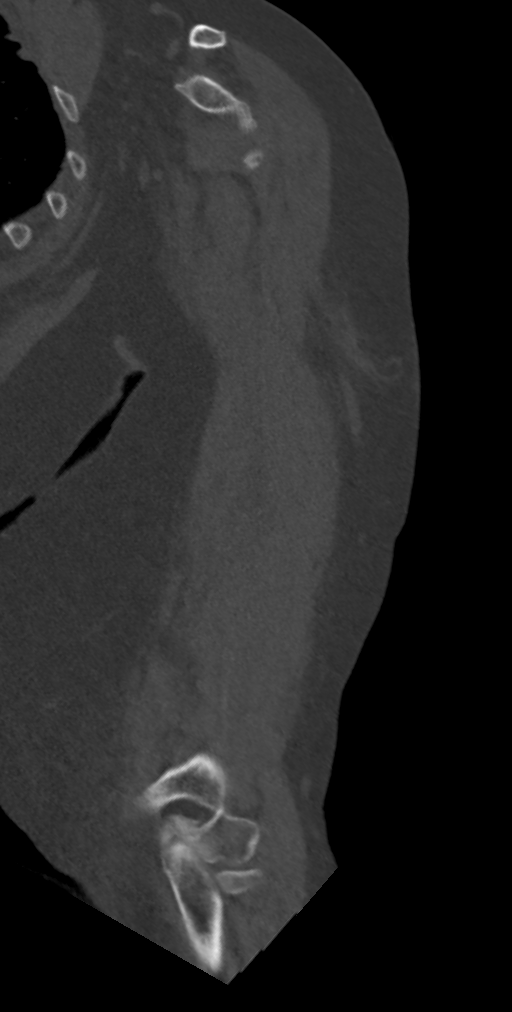
[im 80/181  bone]
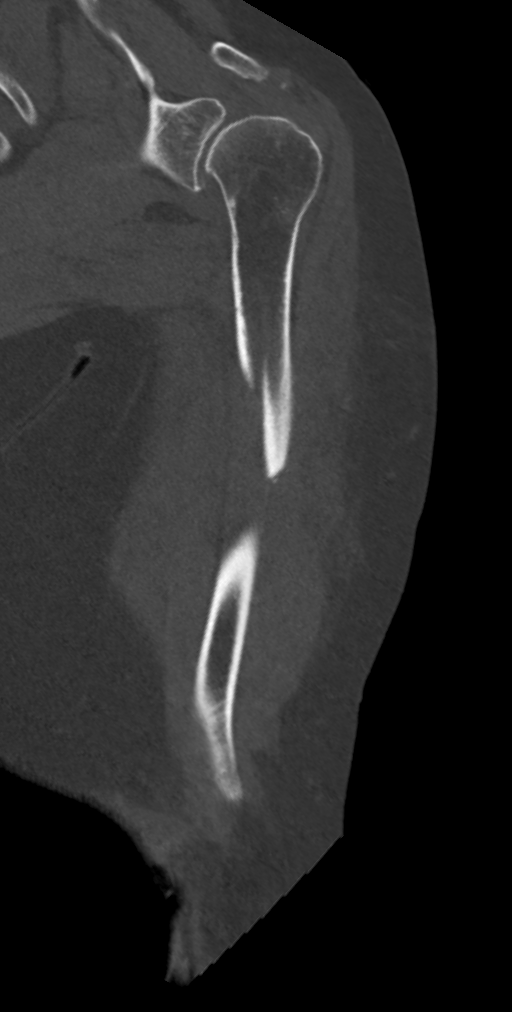
[im 101/181  bone]
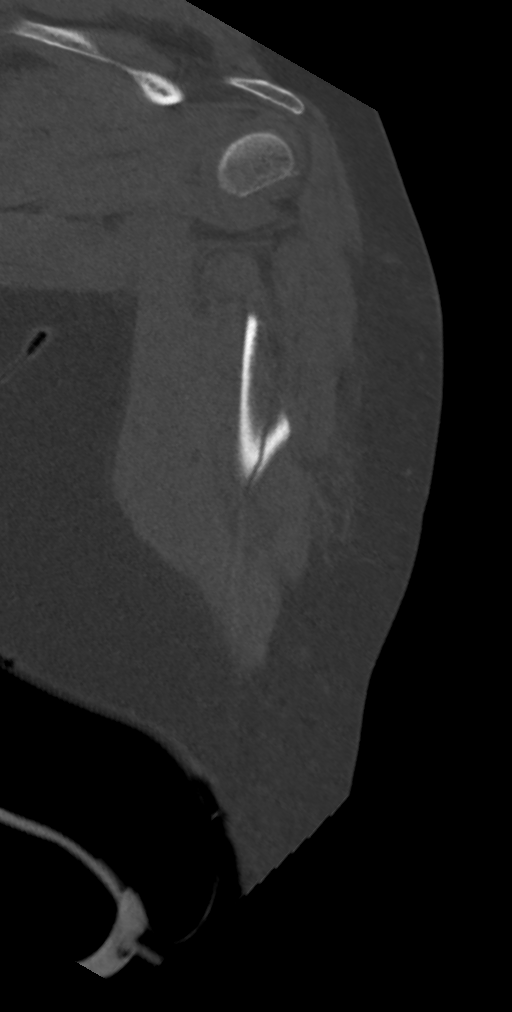

[Series 9: ax st · oblique · 0.35mm/px · 3 of 410 slices shown, 4 images]
[im 103/410  soft-tissue]
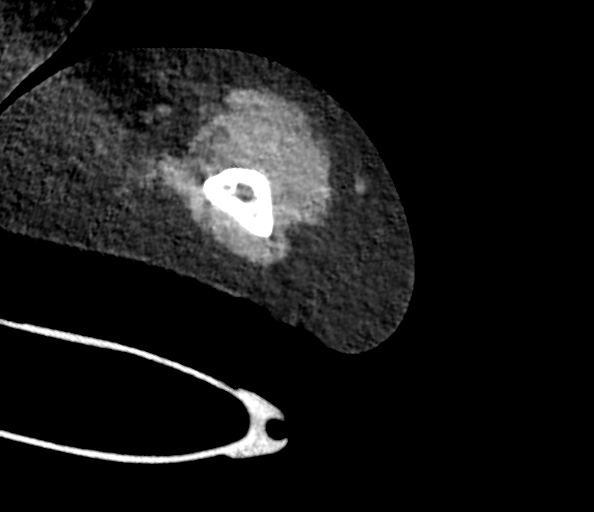
[im 103/410  bone]
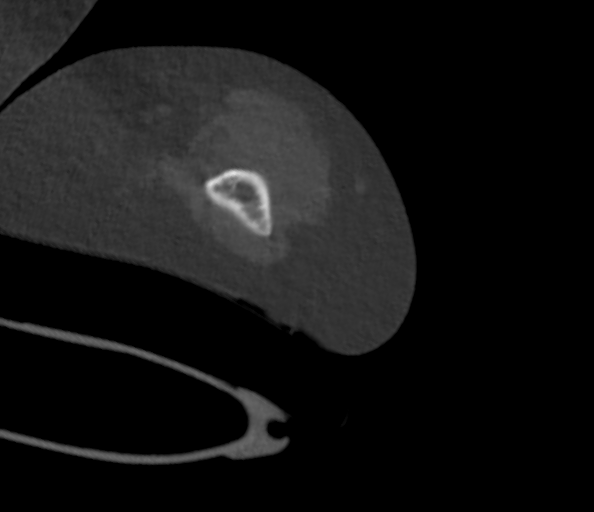
[im 205/410  bone]
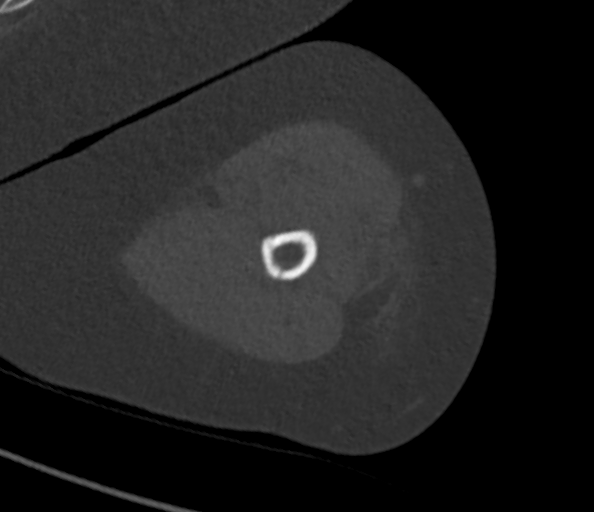
[im 307/410  bone]
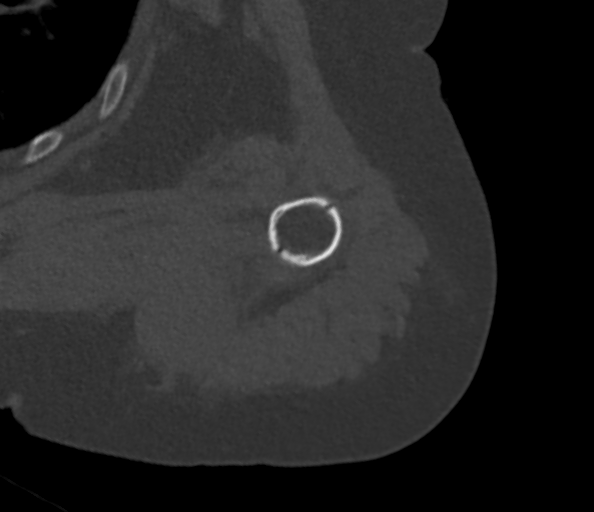

[6 of 20 positions shown; findings below may reference images not displayed]

FINDINGS: Bones/Joint/Cartilage

There is a acute and comminuted fracture deformity involving the
proximal and mid diaphysis of the right humerus. There is posterior
displacement of the distal fracture fragments with override of
fracture fragments by approximately 2 cm. Anterior angulation of the
distal fracture fragments by approximately 31 degrees is noted. A
secondary, nondisplaced, obliquely oriented fracture line extends
proximally around the anterior cortex of the proximal humeral
diaphysis up towards the surgical neck of the proximal humerus.
Visualized portions of the left of 0 appear intact without signs
joint effusion or fracture.

Ligaments

Suboptimally assessed by CT.

Muscles and Tendons

Soft tissue edema with stranding is identified involving the deltoid
muscle and biceps muscle.

Soft tissues

No significant hematoma identified. Soft tissue stranding and edema
noted involving the biceps muscle and deltoid muscle.
IMPRESSION: Acute and comminuted fracture deformity involving the proximal and
mid diaphysis of the right humerus with posterior displacement of
the distal fracture fragments with override of the distal fracture
fragments by approximately 2 cm. There is mild anterior angulation
of the distal fracture fragments by approximately 31 degrees. A
secondary, nondisplaced, obliquely oriented fracture line extends
proximally around the anterior cortex of the proximal humeral
diaphysis up towards the surgical neck of the proximal humerus.

## 2023-07-23 IMAGING — CT CT HEAD W/O CM
3 of 5 series · 15 of 47 positions shown, 18 images · non-contrast
Comparison: None Available.

CLINICAL DATA: Blunt poly trauma



[Series 3: head 2.0 h70h · axial · 0.44mm/px · z∈[-149,-17]mm · 9 of 79 slices shown, 12 images]
[im 7/79  brain]
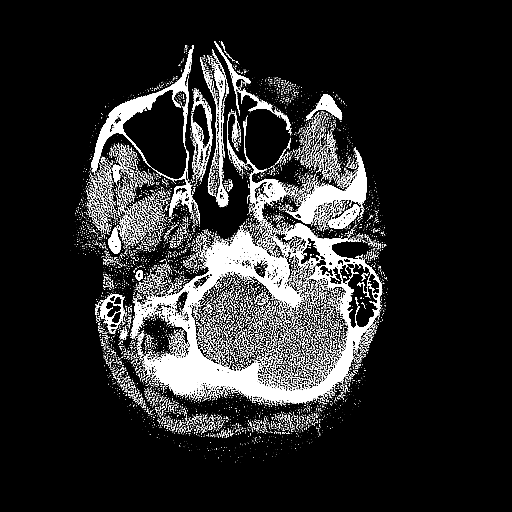
[im 7/79  bone]
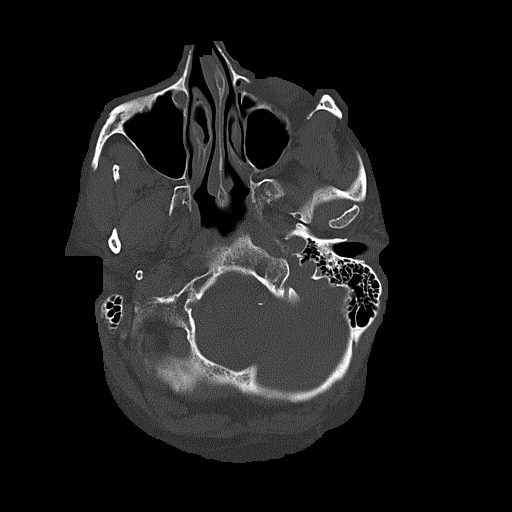
[im 19/79  brain]
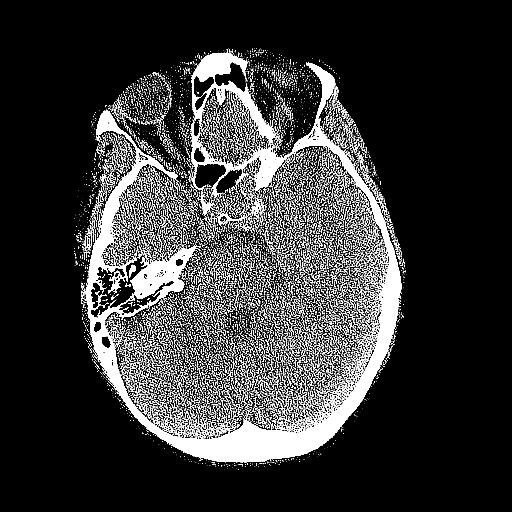
[im 25/79  brain]
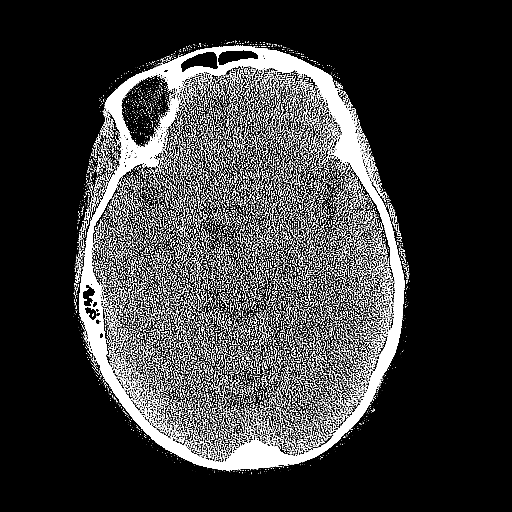
[im 31/79  brain]
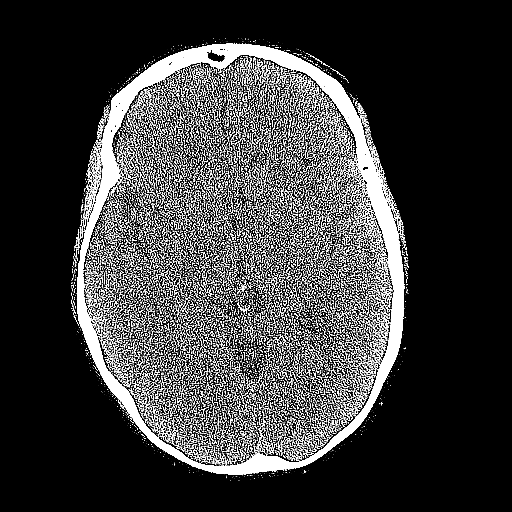
[im 43/79  brain]
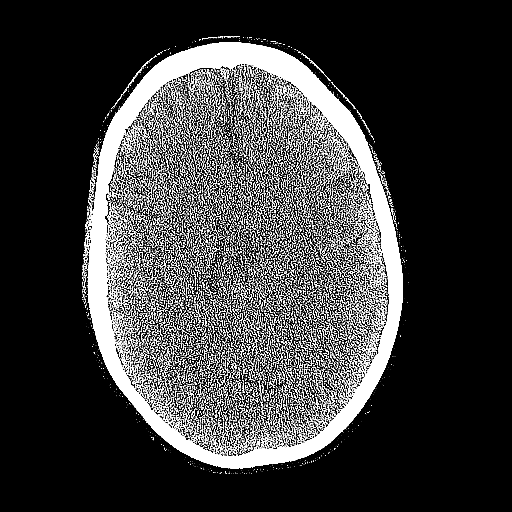
[im 43/79  bone]
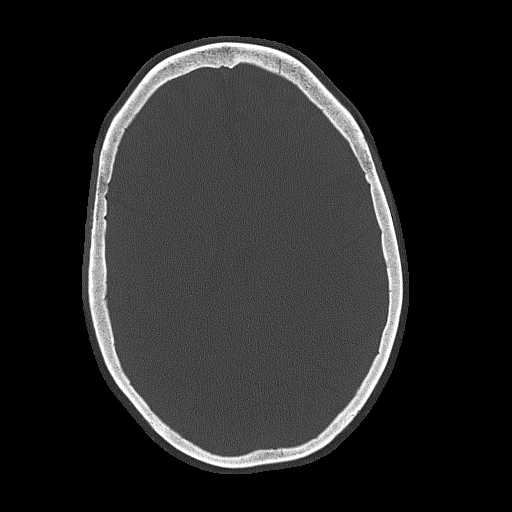
[im 49/79  brain]
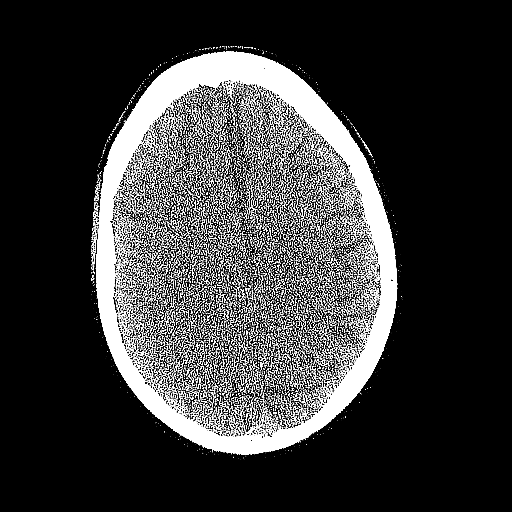
[im 55/79  brain]
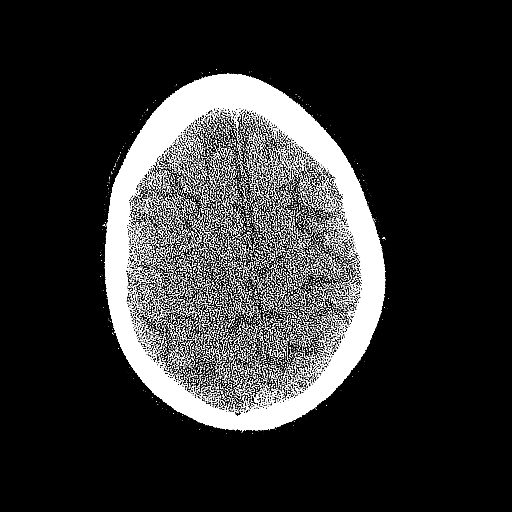
[im 67/79  brain]
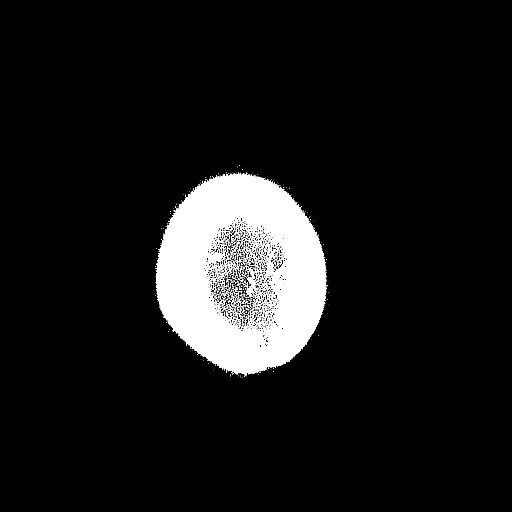
[im 73/79  brain]
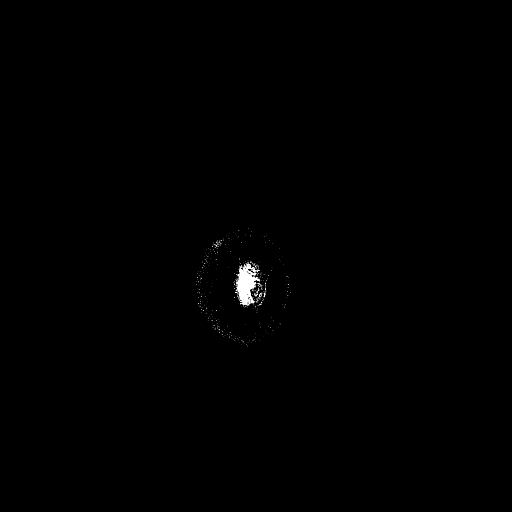
[im 73/79  bone]
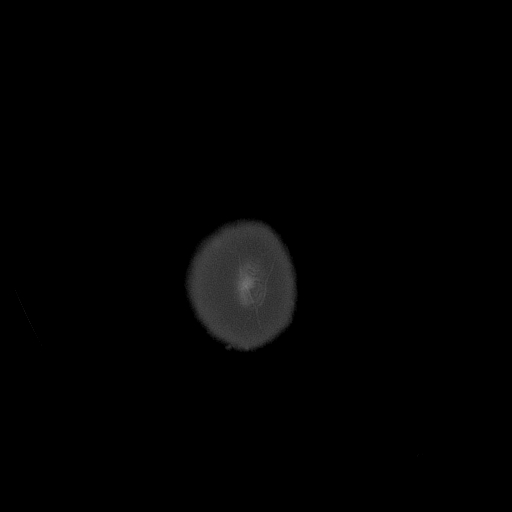

[Series 4: head 3.0 mpr cor · coronal · 0.32mm/px · 3 of 70 slices shown]
[im 24/70  brain]
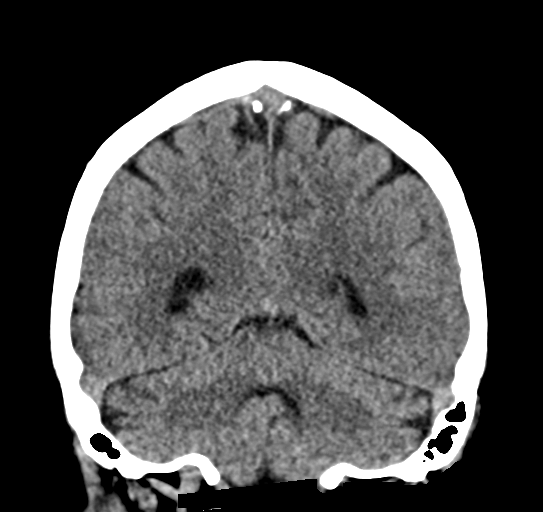
[im 31/70  brain]
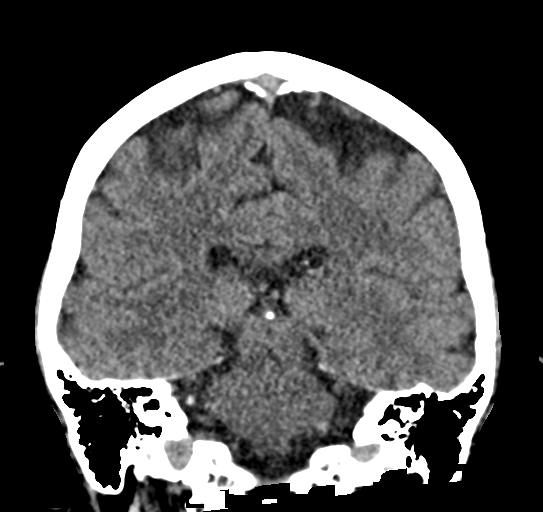
[im 39/70  brain]
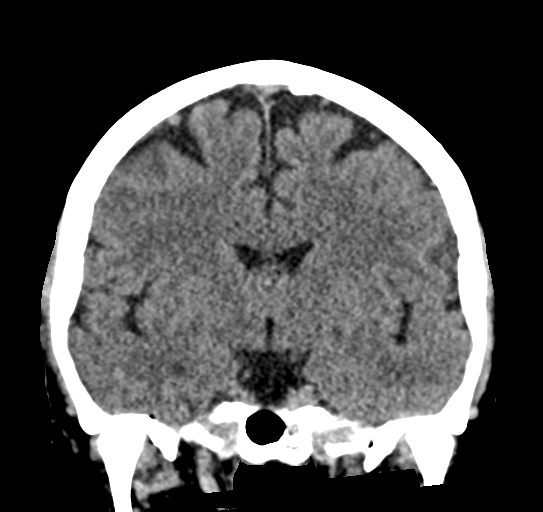

[Series 5: head 3.0 mpr sag · sagittal · 0.32mm/px · 3 of 58 slices shown]
[im 20/58  brain]
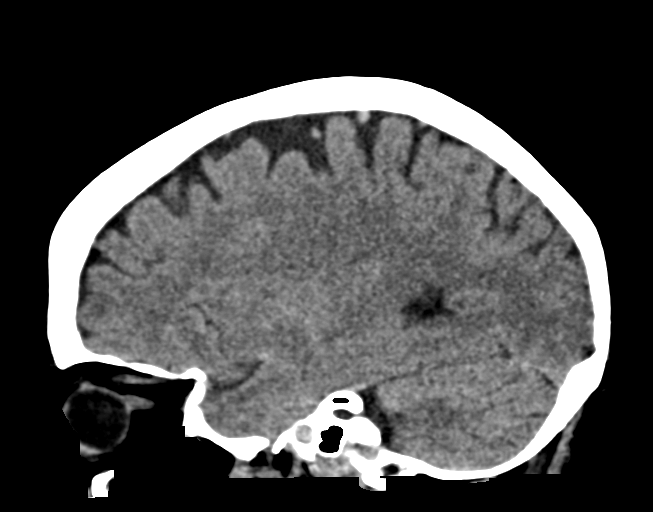
[im 29/58  brain]
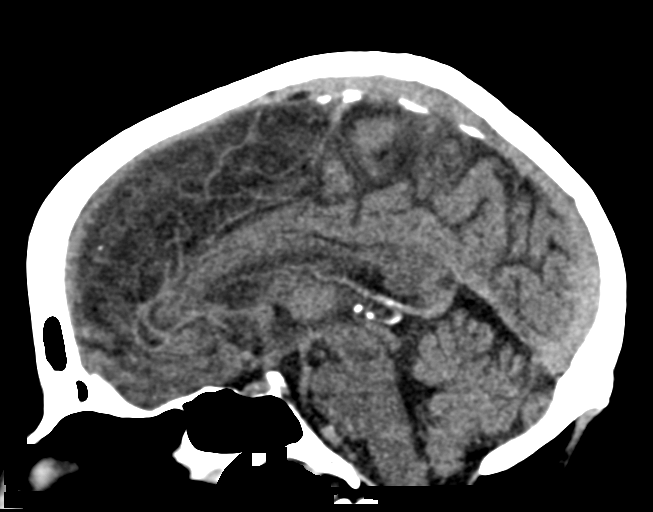
[im 38/58  brain]
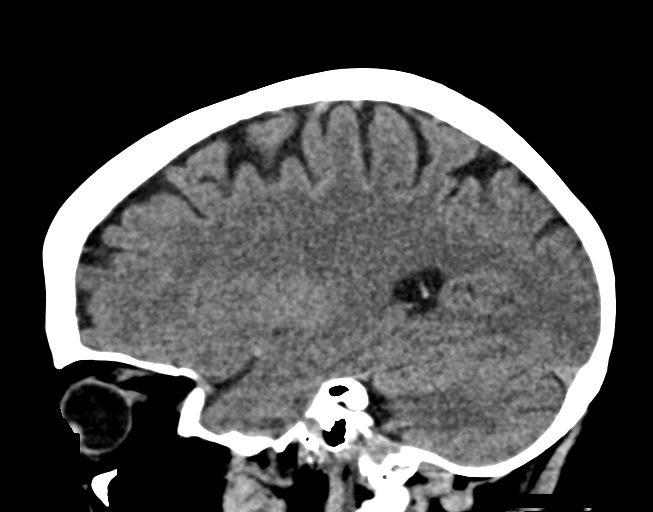

[15 of 47 positions shown; findings below may reference images not displayed]

FINDINGS: CT HEAD FINDINGS

Brain: No evidence of swelling, infarction, hemorrhage,
hydrocephalus, extra-axial collection or mass lesion/mass effect.

Vascular: No hyperdense vessel or unexpected calcification.

Skull: Normal. Negative for fracture or focal lesion.

Sinuses/Orbits: No acute finding.

CT CERVICAL SPINE FINDINGS

Alignment: No traumatic malalignment

Skull base and vertebrae: No acute fracture or incidental bone
lesion

Soft tissues and spinal canal: No prevertebral fluid or swelling. No
visible canal hematoma.

Disc levels: Ordinary lower cervical disc space narrowing and
ridging. Upper cervical facet osteoarthritis.

Upper chest: Negative
IMPRESSION: No evidence of acute intracranial or cervical spine injury.

## 2023-07-31 DIAGNOSIS — E1122 Type 2 diabetes mellitus with diabetic chronic kidney disease: Secondary | ICD-10-CM | POA: Diagnosis not present

## 2023-08-02 IMAGING — RF DG HUMERUS 2V *L*
1 series · 6 of 6 positions shown · non-contrast
Comparison: Left humerus radiographs 01/21/2022

CLINICAL DATA: Left humeral intramedullary nail fixation.

EXAM:
LEFT HUMERUS - 2+ VIEW

[Series 1: run · 6 of 6 slices shown]
[im 1/6]
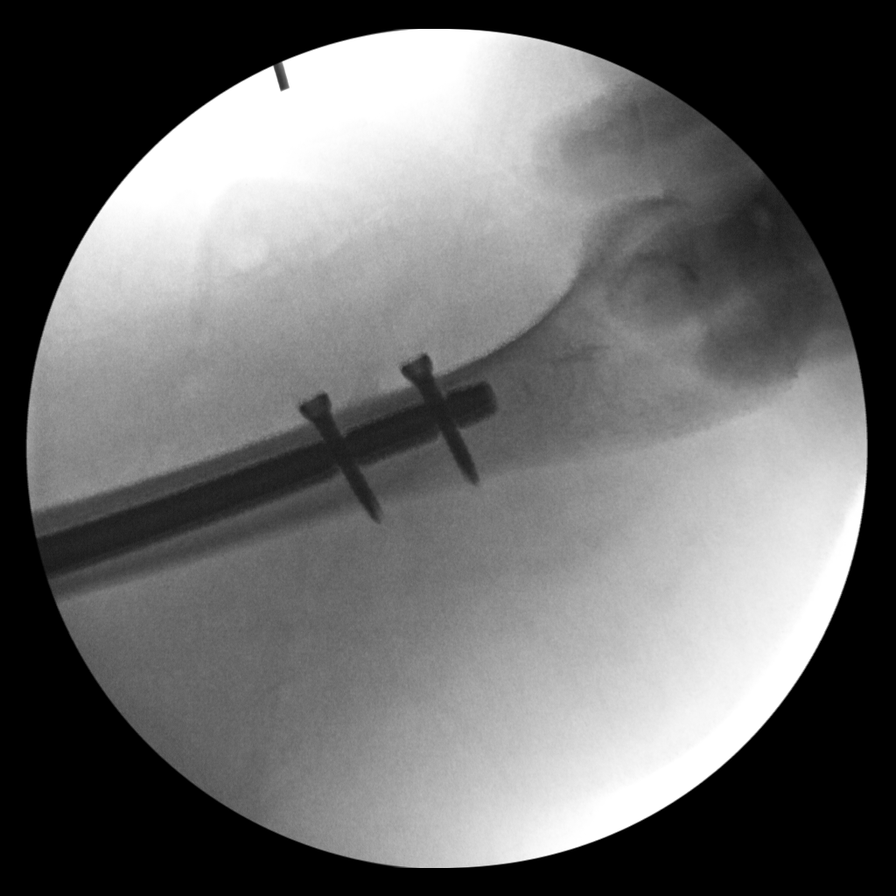
[im 2/6]
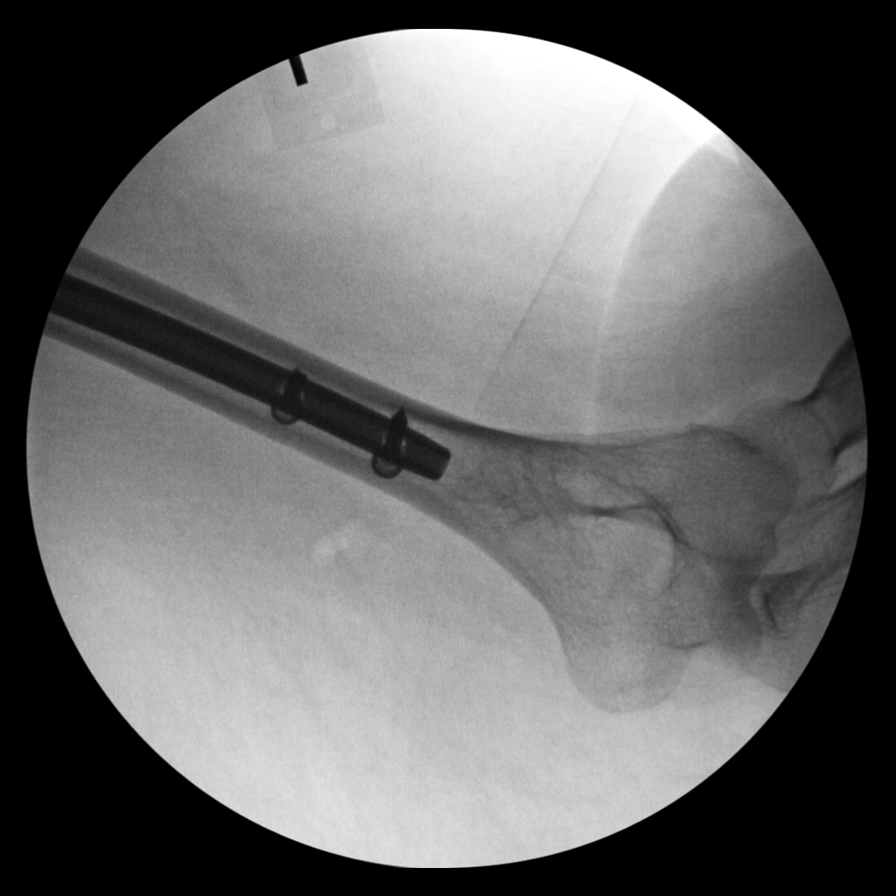
[im 3/6]
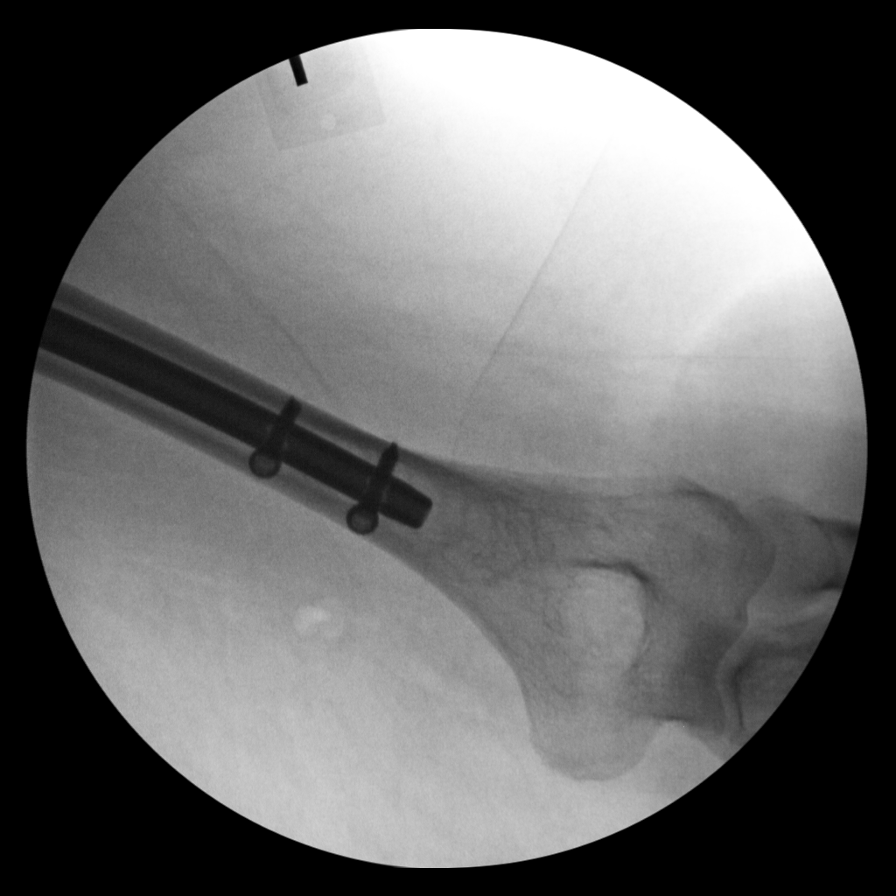
[im 4/6]
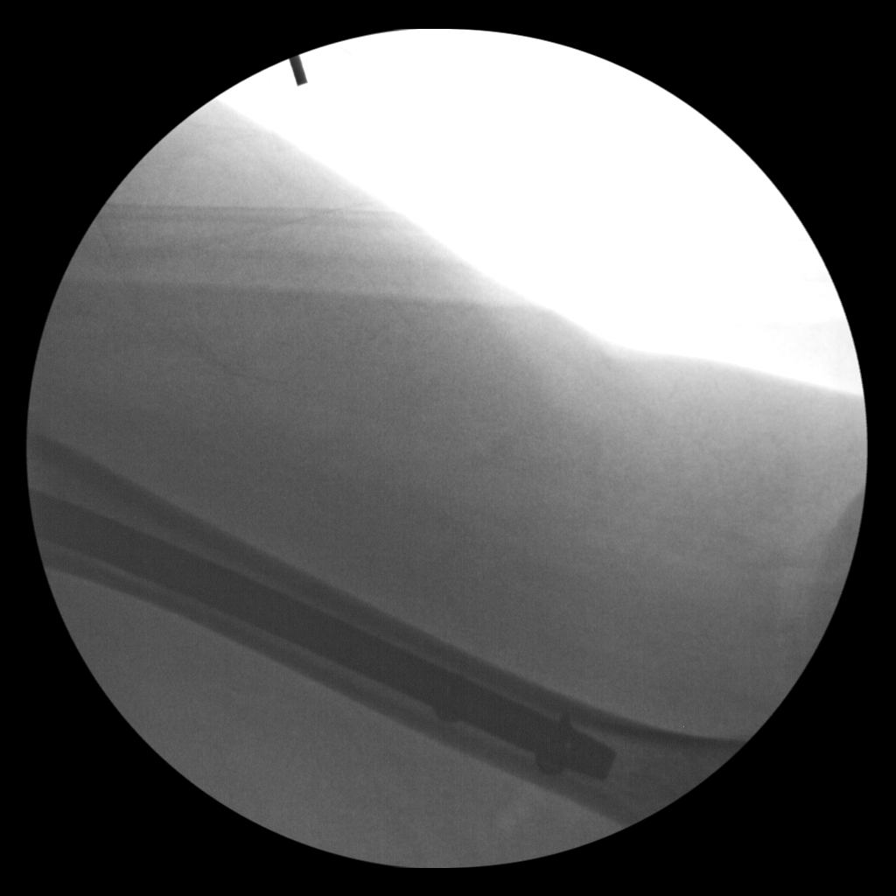
[im 5/6]
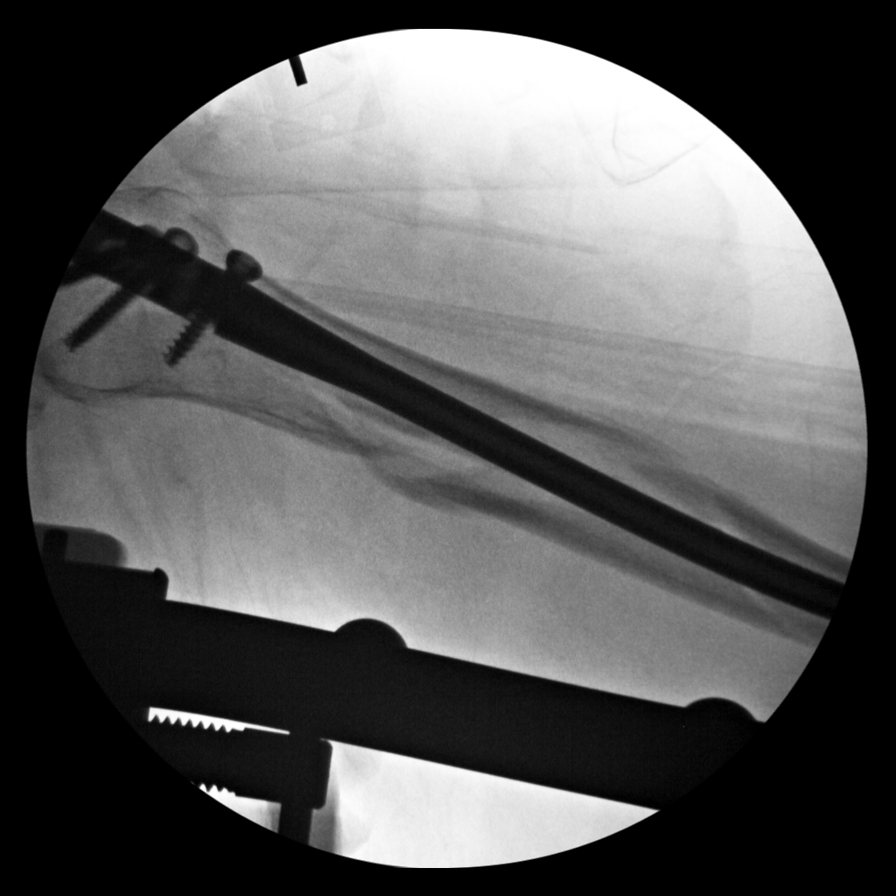
[im 6/6]
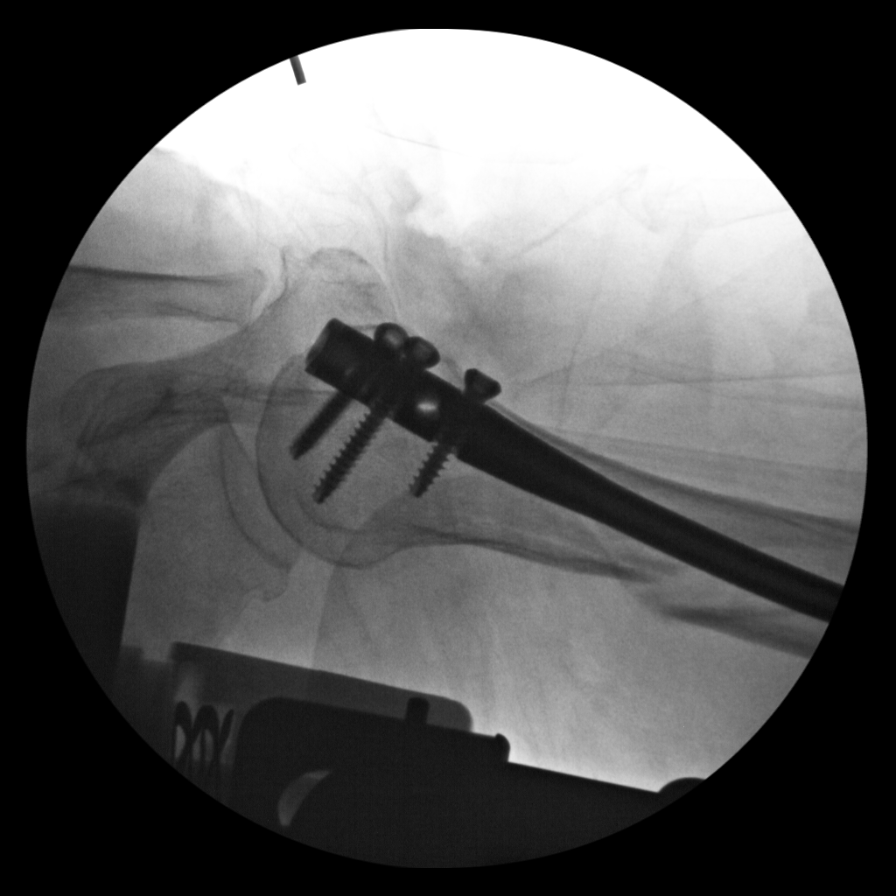

[6 of 6 positions shown; findings below may reference images not displayed]

FINDINGS: Images were performed intraoperatively without the presence of a
radiologist. New intramedullary nail fixation of the humeral head
through the distal diaphysis spanning the prior comminuted oblique
acute to subacute proximal diaphyseal fracture. No evidence of
hardware complication.

Total fluoroscopy images: 6

Total fluoroscopy time: 253 seconds

Total dose: Radiation Exposure Index (as provided by the
fluoroscopic device): 20.66 mGy air Kerma

Please see intraoperative findings for further detail.
IMPRESSION: Intraoperative fluoro for intramedullary nail fixation of humeral
diaphyseal fracture.

## 2024-05-06 DIAGNOSIS — I129 Hypertensive chronic kidney disease with stage 1 through stage 4 chronic kidney disease, or unspecified chronic kidney disease: Secondary | ICD-10-CM | POA: Diagnosis not present

## 2024-05-06 DIAGNOSIS — Z23 Encounter for immunization: Secondary | ICD-10-CM | POA: Diagnosis not present

## 2024-05-06 DIAGNOSIS — E7800 Pure hypercholesterolemia, unspecified: Secondary | ICD-10-CM | POA: Diagnosis not present

## 2024-05-06 DIAGNOSIS — N1831 Chronic kidney disease, stage 3a: Secondary | ICD-10-CM | POA: Diagnosis not present

## 2024-05-06 DIAGNOSIS — M810 Age-related osteoporosis without current pathological fracture: Secondary | ICD-10-CM | POA: Diagnosis not present

## 2024-05-06 DIAGNOSIS — E1122 Type 2 diabetes mellitus with diabetic chronic kidney disease: Secondary | ICD-10-CM | POA: Diagnosis not present
